# Patient Record
Sex: Male | Born: 1935 | Race: White | Hispanic: No | Smoking: Former smoker
Health system: Southern US, Community
[De-identification: ages and names within clinical notes are randomized; demographics above are authoritative.]

## PROBLEM LIST (undated history)

## (undated) DIAGNOSIS — I422 Other hypertrophic cardiomyopathy: Secondary | ICD-10-CM

## (undated) DIAGNOSIS — D649 Anemia, unspecified: Secondary | ICD-10-CM

## (undated) DIAGNOSIS — N183 Chronic kidney disease, stage 3 (moderate): Secondary | ICD-10-CM

## (undated) DIAGNOSIS — I2089 Other forms of angina pectoris: Secondary | ICD-10-CM

## (undated) DIAGNOSIS — F329 Major depressive disorder, single episode, unspecified: Secondary | ICD-10-CM

## (undated) DIAGNOSIS — I10 Essential (primary) hypertension: Secondary | ICD-10-CM

## (undated) DIAGNOSIS — N189 Chronic kidney disease, unspecified: Secondary | ICD-10-CM

## (undated) DIAGNOSIS — I509 Heart failure, unspecified: Secondary | ICD-10-CM

## (undated) DIAGNOSIS — C801 Malignant (primary) neoplasm, unspecified: Secondary | ICD-10-CM

## (undated) DIAGNOSIS — G4700 Insomnia, unspecified: Secondary | ICD-10-CM

## (undated) DIAGNOSIS — D75839 Thrombocytosis, unspecified: Secondary | ICD-10-CM

## (undated) DIAGNOSIS — D473 Essential (hemorrhagic) thrombocythemia: Secondary | ICD-10-CM

## (undated) DIAGNOSIS — F32A Depression, unspecified: Secondary | ICD-10-CM

## (undated) DIAGNOSIS — D490 Neoplasm of unspecified behavior of digestive system: Secondary | ICD-10-CM

## (undated) DIAGNOSIS — R4701 Aphasia: Secondary | ICD-10-CM

## (undated) DIAGNOSIS — N400 Enlarged prostate without lower urinary tract symptoms: Secondary | ICD-10-CM

## (undated) DIAGNOSIS — R1310 Dysphagia, unspecified: Secondary | ICD-10-CM

## (undated) DIAGNOSIS — K219 Gastro-esophageal reflux disease without esophagitis: Secondary | ICD-10-CM

## (undated) DIAGNOSIS — F319 Bipolar disorder, unspecified: Secondary | ICD-10-CM

## (undated) DIAGNOSIS — R195 Other fecal abnormalities: Secondary | ICD-10-CM

## (undated) DIAGNOSIS — K5909 Other constipation: Secondary | ICD-10-CM

## (undated) DIAGNOSIS — Z950 Presence of cardiac pacemaker: Secondary | ICD-10-CM

## (undated) DIAGNOSIS — I4891 Unspecified atrial fibrillation: Secondary | ICD-10-CM

## (undated) DIAGNOSIS — I208 Other forms of angina pectoris: Secondary | ICD-10-CM

## (undated) DIAGNOSIS — E870 Hyperosmolality and hypernatremia: Secondary | ICD-10-CM

## (undated) HISTORY — PX: PACEMAKER INSERTION: SHX728

## (undated) HISTORY — PX: FRACTURE SURGERY: SHX138

## (undated) HISTORY — DX: Neoplasm of unspecified behavior of digestive system: D49.0

## (undated) HISTORY — DX: Anemia, unspecified: D64.9

## (undated) HISTORY — DX: Other constipation: K59.09

## (undated) HISTORY — DX: Other fecal abnormalities: R19.5

---

## 1998-06-16 ENCOUNTER — Encounter: Payer: Self-pay | Admitting: *Deleted

## 1998-06-16 ENCOUNTER — Encounter: Payer: Self-pay | Admitting: Emergency Medicine

## 1998-06-17 ENCOUNTER — Inpatient Hospital Stay (HOSPITAL_COMMUNITY): Admission: EM | Admit: 1998-06-17 | Discharge: 1998-06-22 | Payer: Self-pay | Admitting: *Deleted

## 2002-12-13 ENCOUNTER — Emergency Department (HOSPITAL_COMMUNITY): Admission: EM | Admit: 2002-12-13 | Discharge: 2002-12-13 | Payer: Self-pay | Admitting: Emergency Medicine

## 2002-12-13 ENCOUNTER — Encounter: Payer: Self-pay | Admitting: Emergency Medicine

## 2002-12-23 ENCOUNTER — Encounter: Payer: Self-pay | Admitting: *Deleted

## 2002-12-23 ENCOUNTER — Emergency Department (HOSPITAL_COMMUNITY): Admission: EM | Admit: 2002-12-23 | Discharge: 2002-12-23 | Payer: Self-pay | Admitting: *Deleted

## 2009-03-04 ENCOUNTER — Ambulatory Visit: Payer: Self-pay | Admitting: Cardiology

## 2010-09-25 ENCOUNTER — Ambulatory Visit (INDEPENDENT_AMBULATORY_CARE_PROVIDER_SITE_OTHER): Payer: Medicare Other | Admitting: Internal Medicine

## 2010-09-25 DIAGNOSIS — Z7901 Long term (current) use of anticoagulants: Secondary | ICD-10-CM

## 2010-09-25 DIAGNOSIS — R195 Other fecal abnormalities: Secondary | ICD-10-CM

## 2010-10-02 ENCOUNTER — Other Ambulatory Visit (INDEPENDENT_AMBULATORY_CARE_PROVIDER_SITE_OTHER): Payer: Self-pay | Admitting: Internal Medicine

## 2010-10-02 ENCOUNTER — Encounter (INDEPENDENT_AMBULATORY_CARE_PROVIDER_SITE_OTHER): Payer: Medicare Other | Admitting: Internal Medicine

## 2010-10-02 ENCOUNTER — Ambulatory Visit (HOSPITAL_COMMUNITY)
Admission: RE | Admit: 2010-10-02 | Discharge: 2010-10-02 | Disposition: A | Discharge status: Home / Self Care | Payer: Medicare Other | Source: Ambulatory Visit | Attending: Internal Medicine | Admitting: Internal Medicine

## 2010-10-02 DIAGNOSIS — Z79899 Other long term (current) drug therapy: Secondary | ICD-10-CM | POA: Insufficient documentation

## 2010-10-02 DIAGNOSIS — D509 Iron deficiency anemia, unspecified: Secondary | ICD-10-CM | POA: Insufficient documentation

## 2010-10-02 DIAGNOSIS — Z950 Presence of cardiac pacemaker: Secondary | ICD-10-CM | POA: Insufficient documentation

## 2010-10-02 DIAGNOSIS — D126 Benign neoplasm of colon, unspecified: Secondary | ICD-10-CM

## 2010-10-02 DIAGNOSIS — D649 Anemia, unspecified: Secondary | ICD-10-CM

## 2010-10-02 DIAGNOSIS — E785 Hyperlipidemia, unspecified: Secondary | ICD-10-CM | POA: Insufficient documentation

## 2010-10-02 DIAGNOSIS — K921 Melena: Secondary | ICD-10-CM | POA: Insufficient documentation

## 2010-10-02 DIAGNOSIS — K5731 Diverticulosis of large intestine without perforation or abscess with bleeding: Secondary | ICD-10-CM

## 2010-10-02 DIAGNOSIS — I10 Essential (primary) hypertension: Secondary | ICD-10-CM | POA: Insufficient documentation

## 2010-10-02 DIAGNOSIS — R195 Other fecal abnormalities: Secondary | ICD-10-CM

## 2010-10-02 DIAGNOSIS — K6389 Other specified diseases of intestine: Secondary | ICD-10-CM

## 2010-10-02 DIAGNOSIS — K5730 Diverticulosis of large intestine without perforation or abscess without bleeding: Secondary | ICD-10-CM | POA: Insufficient documentation

## 2010-10-02 HISTORY — PX: COLONOSCOPY: SHX174

## 2010-10-02 LAB — DIFFERENTIAL
Basophils Absolute: 0.3 10*3/uL — ABNORMAL HIGH (ref 0.0–0.1)
Basophils Relative: 4 % — ABNORMAL HIGH (ref 0–1)
Eosinophils Absolute: 0.3 10*3/uL (ref 0.0–0.7)
Eosinophils Relative: 4 % (ref 0–5)
Lymphocytes Relative: 21 % (ref 12–46)
Lymphs Abs: 1.8 10*3/uL (ref 0.7–4.0)
Monocytes Absolute: 0.8 10*3/uL (ref 0.1–1.0)
Monocytes Relative: 10 % (ref 3–12)
Neutro Abs: 5.5 10*3/uL (ref 1.7–7.7)
Neutrophils Relative %: 63 % (ref 43–77)

## 2010-10-02 LAB — COMPREHENSIVE METABOLIC PANEL
ALT: 20 U/L (ref 0–53)
AST: 31 U/L (ref 0–37)
Albumin: 4 g/dL (ref 3.5–5.2)
Alkaline Phosphatase: 35 U/L — ABNORMAL LOW (ref 39–117)
BUN: 13 mg/dL (ref 6–23)
CO2: 29 mEq/L (ref 19–32)
Calcium: 9.1 mg/dL (ref 8.4–10.5)
Chloride: 104 mEq/L (ref 96–112)
Creatinine, Ser: 1.73 mg/dL — ABNORMAL HIGH (ref 0.4–1.5)
GFR calc Af Amer: 46 mL/min — ABNORMAL LOW (ref >60)
GFR calc non Af Amer: 38 mL/min — ABNORMAL LOW (ref >60)
Glucose, Bld: 94 mg/dL (ref 70–99)
Potassium: 3.9 mEq/L (ref 3.5–5.1)
Sodium: 143 mEq/L (ref 135–145)
Total Bilirubin: 0.8 mg/dL (ref 0.3–1.2)
Total Protein: 6.8 g/dL (ref 6.0–8.3)

## 2010-10-02 LAB — CBC
MCH: 29 pg (ref 26.0–34.0)
MCHC: 32.2 g/dL (ref 30.0–36.0)
MCV: 90 fL (ref 78.0–100.0)
Platelets: 795 10*3/uL — ABNORMAL HIGH (ref 150–400)
RDW: 15.8 % — ABNORMAL HIGH (ref 11.5–15.5)

## 2010-10-02 LAB — LACTATE DEHYDROGENASE: LDH: 224 U/L (ref 94–250)

## 2010-10-03 LAB — IRON AND TIBC: UIBC: 347 ug/dL

## 2010-10-13 NOTE — Op Note (Signed)
[name], [name]             ACCOUNT NO.:  [id number]  MEDICAL RECORD NO.:  [id number]           PATIENT TYPE:  O  LOCATION:  DAYP                          FACILITY:  [facility]  PHYSICIAN:  Lionel December, M.D.    DATE OF BIRTH:  04-12-1925  DATE OF PROCEDURE:  10/02/2010 DATE OF DISCHARGE:                              OPERATIVE REPORT   PROCEDURE:  Colonoscopy with snare polypectomy.  INDICATIONS:  [name] is an 77 year old Caucasian male who was recently found to have mild anemia and heme-positive stools and thrombocytosis.  He was evaluated by Dr. Derald Macleod.  A colonoscopy was advised to make sure he does not have a colonic neoplasm.  He has had problems with constipation.  Procedure risks were reviewed with the patient.  Informed consent was obtained.  This is the patient's first colonoscopy.  MEDICATIONS FOR CONSCIOUS SEDATION:  Versed 3 mg IV.  FINDINGS:  Procedure performed in endoscopy suite.  The patient's vitalsigns and O2 saturations were monitored during the procedure and remained stable.  The patient was placed in left lateral recumbent position and rectal examination performed.  No abnormality noted on external or digital exam.  Pentax videoscope was placed through rectum and advanced under vision into sigmoid colon and beyond.  Preparation was excellent.  He had some liquid stool in dependent areas which was easily suctioned out.  He had diffuse pigmentation which was more pronounced in the ascending colon and cecum consistent with melanosis coli.  Few diverticula were noted at sigmoid colon.  Scope was passed into cecum which was identified by appendiceal orifice and ileocecal valve.  Pictures were taken for the record.  As the scope was withdrawn, colonic mucosa was carefully examined.  There was a 4 to 5-mm polyp at hepatic flexure which was ablated via cold biopsy.  There was another 6 to 7-mm sessile polyp at splenic flexure.  This polyp was elevated to  6 mL of saline injection and polypectomy performed.  This polyp was retrieved for sludge examination.  Mucosa of rest of the colon was normal.  Rectal mucosa similarly was normal.  The scope was retroflexed to examine anorectal junction and small hemorrhoids noted below the dentate line.  Endoscope was straightened and withdrawn.  Withdrawal time was 19 minutes.  The patient tolerated the procedure well.  FINAL DIAGNOSES: 1. Examination performed to cecum. 2. Melanosis coli with diffuse pigmentation which was more pronounced     in the ascending colon and cecum. 3. Two polyps removed, one via cold biopsy rheumatic flexion and other     one from splenic flexure via saline-assisted polypectomy. 4. Few scattered diverticula at sigmoid colon and external     hemorrhoids.  RECOMMENDATIONS: 1. Standard instructions given.  No aspirin for 1 week. 2. High-fiber diet.  If he continues to experience problems with     constipation, he can start taking 2 tablets of Colace daily.  I     will be contacting the patient and his daughter with biopsy     results.          ______________________________ Lionel December, M.D.     NR/MEDQ  D:  10/02/2010  T:  10/03/2010  Job:  [id number]  cc:   Normand Sloop, MD Fax: [phone]  Paulene Floor, NP [facility]  Electronically Signed by Lionel December M.D. on 10/13/2010 09:17:32 PM

## 2010-10-13 NOTE — Consult Note (Addendum)
NAMECLEMENTE, [name]             ACCOUNT NO.:  [id number]  MEDICAL RECORD NO.:  [id number]           PATIENT TYPE: AMB.  LOCATION:                                 [facility]  PHYSICIAN:  Lionel December, M.D.    DATE OF BIRTH:  1924-07-04  DATE :  09/25/2010                                 CONSULTATION   REFERRING PHYSICIAN:  Normand Sloop, MD    REASON FOR CONSULTATION:  Anemia, heme-positive stools.  HISTORY OF PRESENT ILLNESS:  [name] is an 77 year old male referred to our office by Dr. Derald Macleod for heme-positive stools and anemia.  He says he saw Dr. Derald Macleod about 3 weeks ago and two of his stool cards were positive for blood.  He has never undergone a colonoscopy in the past.  On September 04, 2010, WBC count was 9400, hemoglobin 12.8, platelets 874,000, and his fecal occult blood was positive x2.  Iron studies:  Iron was 61, TIBC 473, percent saturation 13.  Mr. Duman does see Dr. Derald Macleod for his high platelet count.  He says his appetite is good.  There has been no weight loss, no abdominal pain.  He usually has one bowel movement a day.  His stools are brown in color.  He does complain of some constipation at times.  If he eats, he will bloat.  He will have sensation of bloating if he does not take his omeprazole.  He is allergic to DEMEROL and MORPHINE.  PAST SURGICAL HISTORY:  He has had appendectomy, right inguinal repair, right knee arthroscopy, a pacemaker.  PAST MEDICAL HISTORY: 1. Atrial fib. 2. Pacemaker. 3. Hypertension. 4. High cholesterol.  FAMILY HISTORY:  His mother is deceased from natural causes.  Father is deceased from coronary artery disease.  He has five sisters, one is deceased from breathing problems, four are alive.  He has four brothers, one is deceased at age 64 from gangrene, one is deceased from lung cancer, and two are alive.  He is widowed.  He is retired.  He does not smoke, drink, or do drugs.  He has four  children, three are in good health.  One was involved in an MVC.  HOME MEDICATIONS: 1. Temazepam 7.5 mg. 2. Atenolol 50 mg. 3. Omeprazole 20 mg. 4. TriCor 145 mg. 5. Potassium chloride 20 mEq one a day. 6. Warfarin 2 mg daily except 1 day on which he takes 3 mg. 7. Lasix 40 mg 1-1/2 tablets b.i.d. 8. Flomax 0.4 mg a day. 9. Gabapentin 100 mg a day. 10.Crestor 10 mg a day. 11.Vitamin B12 - 1000 mg a day. 12.Vitamin D3 two a day. 13.Senna 4 tablets a day.  OBJECTIVE:  VITAL SIGNS:  His weight is 216, his height is 6 feet 2 inches, temperature is 98.4, blood pressure is 102/70, pulse is 72. HEENT:  He has upper and lower dentures.  His oral mucosa is moist. There are no lesions.  His conjunctivae are pink.  His sclerae are anicteric. NECK:  His thyroid is normal.  There is no cervical lymphadenopathy. LUNGS:  Clear. HEART:  Regular. ABDOMEN:  Soft.  Bowel sounds  are positive.  No masses.  His stool was guaiac negative today.  ASSESSMENT:  [name] is an 77 year old male, referred to our office by Dr. Derald Macleod for anemia and heme-positive stools.  A colonic neoplasm or arteriovenous malformation needs to be ruled out.  RECOMMENDATIONS:  We will schedule a colonoscopy in the near future with Dr. Karilyn Cota.  The risks and benefits were reviewed with the patient.  He is agreeable.  His daughter is present in the room also.  Thank you for allowing [place] to participate in his care.    ______________________________ Dorene Ar, NP   ______________________________ Lionel December, M.D.    TS/MEDQ  D:  09/26/2010  T:  09/26/2010  Job:  [id number]  cc:   Normand Sloop, MD Fax: [phone]  Electronically Signed by Lionel December M.D. on 10/13/2010 09:19:29 PM

## 2010-11-12 DIAGNOSIS — I2510 Atherosclerotic heart disease of native coronary artery without angina pectoris: Secondary | ICD-10-CM

## 2011-01-29 ENCOUNTER — Encounter (INDEPENDENT_AMBULATORY_CARE_PROVIDER_SITE_OTHER): Payer: Self-pay

## 2011-10-20 ENCOUNTER — Other Ambulatory Visit (HOSPITAL_COMMUNITY): Payer: Self-pay | Admitting: Nephrology

## 2011-10-20 DIAGNOSIS — N289 Disorder of kidney and ureter, unspecified: Secondary | ICD-10-CM

## 2011-10-31 ENCOUNTER — Encounter (HOSPITAL_COMMUNITY): Payer: Self-pay

## 2011-10-31 ENCOUNTER — Emergency Department (HOSPITAL_COMMUNITY): Payer: Medicare Other

## 2011-10-31 ENCOUNTER — Emergency Department (HOSPITAL_COMMUNITY)
Admission: EM | Admit: 2011-10-31 | Discharge: 2011-10-31 | Disposition: A | Discharge status: Home / Self Care | Payer: Medicare Other | Attending: Emergency Medicine | Admitting: Emergency Medicine

## 2011-10-31 DIAGNOSIS — M47812 Spondylosis without myelopathy or radiculopathy, cervical region: Secondary | ICD-10-CM | POA: Insufficient documentation

## 2011-10-31 DIAGNOSIS — J438 Other emphysema: Secondary | ICD-10-CM | POA: Insufficient documentation

## 2011-10-31 DIAGNOSIS — Y92009 Unspecified place in unspecified non-institutional (private) residence as the place of occurrence of the external cause: Secondary | ICD-10-CM | POA: Insufficient documentation

## 2011-10-31 DIAGNOSIS — S8000XA Contusion of unspecified knee, initial encounter: Secondary | ICD-10-CM | POA: Insufficient documentation

## 2011-10-31 DIAGNOSIS — F319 Bipolar disorder, unspecified: Secondary | ICD-10-CM | POA: Insufficient documentation

## 2011-10-31 DIAGNOSIS — W19XXXA Unspecified fall, initial encounter: Secondary | ICD-10-CM

## 2011-10-31 DIAGNOSIS — N189 Chronic kidney disease, unspecified: Secondary | ICD-10-CM | POA: Insufficient documentation

## 2011-10-31 DIAGNOSIS — I422 Other hypertrophic cardiomyopathy: Secondary | ICD-10-CM | POA: Insufficient documentation

## 2011-10-31 DIAGNOSIS — W1809XA Striking against other object with subsequent fall, initial encounter: Secondary | ICD-10-CM | POA: Insufficient documentation

## 2011-10-31 DIAGNOSIS — Z79899 Other long term (current) drug therapy: Secondary | ICD-10-CM | POA: Insufficient documentation

## 2011-10-31 DIAGNOSIS — M542 Cervicalgia: Secondary | ICD-10-CM | POA: Insufficient documentation

## 2011-10-31 DIAGNOSIS — S0093XA Contusion of unspecified part of head, initial encounter: Secondary | ICD-10-CM

## 2011-10-31 DIAGNOSIS — S0003XA Contusion of scalp, initial encounter: Secondary | ICD-10-CM | POA: Insufficient documentation

## 2011-10-31 HISTORY — DX: Depression, unspecified: F32.A

## 2011-10-31 HISTORY — DX: Major depressive disorder, single episode, unspecified: F32.9

## 2011-10-31 HISTORY — DX: Bipolar disorder, unspecified: F31.9

## 2011-10-31 HISTORY — DX: Unspecified atrial fibrillation: I48.91

## 2011-10-31 HISTORY — DX: Presence of cardiac pacemaker: Z95.0

## 2011-10-31 HISTORY — DX: Essential (hemorrhagic) thrombocythemia: D47.3

## 2011-10-31 HISTORY — DX: Chronic kidney disease, unspecified: N18.9

## 2011-10-31 HISTORY — DX: Thrombocytosis, unspecified: D75.839

## 2011-10-31 HISTORY — DX: Other hypertrophic cardiomyopathy: I42.2

## 2011-10-31 LAB — DIFFERENTIAL
Basophils Absolute: 0.3 10*3/uL — ABNORMAL HIGH (ref 0.0–0.1)
Lymphocytes Relative: 29 % (ref 12–46)
Lymphs Abs: 1.9 10*3/uL (ref 0.7–4.0)
Monocytes Absolute: 0.5 10*3/uL (ref 0.1–1.0)
Neutro Abs: 3.6 10*3/uL (ref 1.7–7.7)

## 2011-10-31 LAB — CBC
HCT: 36.9 % — ABNORMAL LOW (ref 39.0–52.0)
RBC: 3.86 MIL/uL — ABNORMAL LOW (ref 4.22–5.81)
RDW: 15.3 % (ref 11.5–15.5)
WBC: 6.6 10*3/uL (ref 4.0–10.5)

## 2011-10-31 LAB — PROTIME-INR: INR: 2.84 — ABNORMAL HIGH (ref 0.00–1.49)

## 2011-10-31 NOTE — ED Notes (Signed)
Ambulated patient around nurses station with no problems.  

## 2011-10-31 NOTE — Discharge Instructions (Signed)
Contusion A contusion is a deep bruise. Contusions are the result of an injury that caused bleeding under the skin. The contusion may turn blue, purple, or yellow. Minor injuries will give you a painless contusion, but more severe contusions may stay painful and swollen for a few weeks.  CAUSES  A contusion is usually caused by a blow, trauma, or direct force to an area of the body. SYMPTOMS   Swelling and redness of the injured area.   Bruising of the injured area.   Tenderness and soreness of the injured area.   Pain.  DIAGNOSIS  The diagnosis can be made by taking a history and physical exam. An X-ray, CT scan, or MRI may be needed to determine if there were any associated injuries, such as fractures. TREATMENT  Specific treatment will depend on what area of the body was injured. In general, the best treatment for a contusion is resting, icing, elevating, and applying cold compresses to the injured area. Over-the-counter medicines may also be recommended for pain control. Ask your caregiver what the best treatment is for your contusion. HOME CARE INSTRUCTIONS   Put ice on the injured area.   Put ice in a plastic bag.   Place a towel between your skin and the bag.   Leave the ice on for 15 to 20 minutes, 3 to 4 times a day.   Only take over-the-counter or prescription medicines for pain, discomfort, or fever as directed by your caregiver. Your caregiver may recommend avoiding anti-inflammatory medicines (aspirin, ibuprofen, and naproxen) for 48 hours because these medicines may increase bruising.   Rest the injured area.   If possible, elevate the injured area to reduce swelling.  SEEK IMMEDIATE MEDICAL CARE IF:   You have increased bruising or swelling.   You have pain that is getting worse.   Your swelling or pain is not relieved with medicines.  MAKE SURE YOU:   Understand these instructions.   Will watch your condition.   Will get help right away if you are not  doing well or get worse.  Document Released: 03/05/2005 Document Revised: 05/15/2011 Document Reviewed: 03/31/2011 [facility] Patient Information 2012 [place], [place].  Fall Prevention, Elderly Falls are the leading cause of injuries, accidents, and accidental deaths in people over the age of 73. Falling is a real threat to your ability to live on your own. CAUSES   Poor eyesight or poor hearing can make you more likely to fall.   Illnesses and physical conditions can affect your strength and balance.   Poor lighting, throw rugs and pets in your home can make you more likely to trip or slip.   The side effects of some medicines can upset your balance and lead to falling. These include medicines for depression, sleep problems, high blood pressure, diabetes, and heart conditions.  PREVENTION  Be sure your home is as safe as possible. Here are some tips:  Wear shoes with non-skid soles (not house slippers).   Be sure your home and outside area are well lit.   Use night lights throughout your house, including hallways and stairways.   Remove clutter and clean up spills on floors and walkways.   Remove throw rugs or fasten them to the floor with carpet tape. Tack down carpet edges.   Do not place electrical cords across pathways.   Install grab bars in your bathtub, shower, and toilet area. Towel bars should not be used as a grab bar.   Install handrails on both  sides of stairways.   Do not climb on stools or stepladders. Get someone else to help with jobs that require climbing.   Do not wax your floors at all, or use a non-skid wax.   Repair uneven or unsafe sidewalks, walkways or stairs.   Keep frequently used items within reach.   Be aware of pets so you do not trip.  Get regular check-ups from your doctor, and take good care of yourself:  Have your eyes checked every year for vision changes, cataracts, glaucoma, and other eye problems. Wear eyeglasses as directed.   Have  your hearing checked every 2 years, or anytime you or others think that you cannot hear well. Use hearing aids as directed.   See your caregiver if you have foot pain or corns. Sore feet can contribute to falls.   Let your caregiver know if a medicine is making you feel dizzy or making you lose your balance.   Use a cane, walker, or wheelchair as directed. Use walker or wheelchair brakes when getting in and out.   When you get up from bed, sit on the side of the bed for 1 to 2 minutes before you stand up. This will give your blood pressure time to adjust, and you will feel less dizzy.   If you need to go to the bathroom often, consider using a bedside commode.  Keep your body in good shape:  Get regular exercise, especially walking.   Do exercises to strengthen the muscles you use for walking and lifting.   Do not smoke.   Minimize use of alcohol.  SEEK IMMEDIATE MEDICAL CARE IF:   You feel dizzy, weak, or unsteady on your feet.   You feel confused.   You fall.  Document Released: 05/26/2005 Document Revised: 05/15/2011 Document Reviewed: 11/20/2006 [facility] Patient Information 2012 [place], [place].

## 2011-10-31 NOTE — ED Provider Notes (Signed)
History  This chart was scribed for Christopher Bailiff, MD by [name]. The patient was seen in room APA04/APA04. Patient's care was started at 2031.  CSN: [id number]  Arrival date & time 10/31/11  2031   First MD Initiated Contact with Patient 10/31/11 2031      Chief Complaint  Patient presents with  . Fall    (Consider location/radiation/quality/duration/timing/severity/associated sxs/prior treatment) Patient is a 78 y.o. male presenting with fall. The history is provided by the patient. No language interpreter was used.  Fall Pertinent negatives include no fever, no abdominal pain, no nausea, no vomiting and no headaches.    [name] is a 78 y.o. male on coumadin who lives at [facility] and was brought into the Emergency Department by ambulance complaining of a fall that occurred immediately prior to arrival in the ED. Pt reports that he tripped over a rug in his home and hit his head on the edge of his night stand. Pt states that he never lost consciousness and does not have any headache. Pt is complaining of mild pain localized to the right shoulder and right knee and a cut above the left eye. Pt denies nausea, vomiting, back pain, and neck pain.   Past Medical History  Diagnosis Date  . Anemia   . Chronic constipation   . Bloating   . Colonic neoplasm   . Occult blood positive stool   . A-fib   . Hypertrophic cardiomyopathy   . Bipolar disorder   . Diastolic heart failure   . Pacemaker   . Depression   . Chronic kidney disease   . Thrombocytosis     Past Surgical History  Procedure Date  . Colonoscopy 10/02/2010    W/POLYP  . Fracture surgery   . Pacemaker insertion   . Cholecystectomy     No family history on file.  History  Substance Use Topics  . Smoking status: Never Smoker   . Smokeless tobacco: Not on file  . Alcohol Use: No      Review of Systems  Constitutional: Negative for fever and chills.  HENT: Negative for ear pain and neck  pain.   Gastrointestinal: Negative for nausea, vomiting and abdominal pain.  Musculoskeletal: Positive for myalgias and arthralgias.  Neurological: Negative for syncope, light-headedness and headaches.  Psychiatric/Behavioral: Negative for confusion.  All other systems reviewed and are negative.    Allergies  Demerol and Morphine and related  Home Medications   Current Outpatient Rx  Name Route Sig Dispense Refill  . FUROSEMIDE 20 MG PO TABS Oral Take 20-40 mg by mouth 2 (two) times daily. Take two tablets by mouth daily in the morning and one tablet in the evening    . GABAPENTIN 100 MG PO CAPS Oral Take 100 mg by mouth 2 (two) times daily.     Marland Kitchen HYDROXYUREA 500 MG PO CAPS Oral Take 500 mg by mouth every other day. May take with food to minimize GI side effects.    Marland Kitchen LORATADINE 10 MG PO TABS Oral Take 10 mg by mouth daily.    Marland Kitchen METOPROLOL TARTRATE 25 MG PO TABS Oral Take 25 mg by mouth 2 (two) times daily.    . CERTA-VITE SENIOR-LUTEIN PO Oral Take 1 tablet by mouth every morning.    Marland Kitchen NITROGLYCERIN 0.2 MG/HR TD PT24 Transdermal Place 1 patch onto the skin daily.    Marland Kitchen OMEPRAZOLE 20 MG PO CPDR Oral Take 20 mg by mouth daily.      Marland Kitchen  POTASSIUM CHLORIDE 20 MEQ PO PACK Oral Take 20 mEq by mouth daily.     Marland Kitchen RISPERIDONE 1 MG PO TABS Oral Take 1 mg by mouth 2 (two) times daily.    . SENNA 8.6 MG PO TABS Oral Take 2 tablets by mouth.    . TAMSULOSIN HCL 0.4 MG PO CAPS Oral Take 0.4 mg by mouth every evening.     Marland Kitchen TEMAZEPAM 7.5 MG PO CAPS Oral Take 7.5 mg by mouth every evening. 6pm    . VITAMIN B-12 1000 MCG PO TABS Oral Take 1,000 mcg by mouth daily.      . WARFARIN SODIUM 2 MG PO TABS Oral Take 2 mg by mouth every evening. 6pm      BP 121/67  Pulse 75  Temp(Src) 98 F (36.7 C) (Oral)  Resp 20  Ht 6\' 2"  (1.88 m)  Wt 210 lb (95.255 kg)  BMI 26.96 kg/m2  SpO2 98%  Physical Exam  Nursing note and vitals reviewed. Constitutional: He is oriented to person, place, and time. He  appears well-developed and well-nourished.  HENT:  Head: Normocephalic.  Right Ear: External ear normal.  Left Ear: External ear normal.  Eyes: Conjunctivae are normal. Pupils are equal, round, and reactive to light. No scleral icterus.  Neck: Normal range of motion. Neck supple.  Cardiovascular: Normal rate and regular rhythm.   Pulmonary/Chest: Effort normal. No respiratory distress.  Abdominal: Soft. Bowel sounds are normal. There is no tenderness.  Musculoskeletal: Normal range of motion. He exhibits no edema and no tenderness (no back tenderness).  Neurological: He is alert and oriented to person, place, and time.  Skin: Skin is warm and dry.  Psychiatric: He has a normal mood and affect. His behavior is normal.    ED Course  Procedures (including critical care time)  DIAGNOSTIC STUDIES: Oxygen Saturation is 98% on room air, normal by my interpretation.    COORDINATION OF CARE: 8:39PM - Will get x-rays of pt. Pt agrees with plan.    Labs Reviewed  PROTIME-INR - Abnormal; Notable for the following:    Prothrombin Time 30.3 (*)    INR 2.84 (*)    All other components within normal limits  CBC - Abnormal; Notable for the following:    RBC 3.86 (*)    Hemoglobin 11.8 (*)    HCT 36.9 (*)    Platelets 540 (*)    All other components within normal limits  DIFFERENTIAL - Abnormal; Notable for the following:    Basophils Relative 4 (*)    Basophils Absolute 0.3 (*)    All other components within normal limits   Dg Chest 1 View  10/31/2011  *RADIOLOGY REPORT*  Clinical Data: Cough after fall today.  Atrial fibrillation and pacemaker.  CHEST - 1 VIEW  Comparison: 11/12/2010  Findings: Stable appearance of cardiac pacemaker since previous study.  Shallow inspiration.  Cardiac enlargement with prominent pulmonary vascularity and mild interstitial changes suggesting pulmonary vascular congestion.  Peribronchial thickening consistent with chronic bronchitis.  No focal airspace  consolidation.  No blunting of costophrenic angles.  No pneumothorax.  IMPRESSION: Cardiac enlargement with mild pulmonary vascular congestion. Chronic bronchitic changes.  No focal consolidation.  Original Report Authenticated By: Marlon Pel, M.D.   Ct Head Wo Contrast  10/31/2011  *RADIOLOGY REPORT*  Clinical Data:  Fall.  Head injury.  Left periorbital laceration.  CT HEAD WITHOUT CONTRAST CT CERVICAL SPINE WITHOUT CONTRAST  Technique:  Multidetector CT imaging of the head and cervical  spine was performed following the standard protocol without intravenous contrast.  Multiplanar CT image reconstructions of the cervical spine were also generated.  Comparison:  CT head from [facility] dated 03/04/2009  CT HEAD  Findings: Left supraorbital soft tissue swelling noted.  No definite intraorbital abnormality is identified (high resolution imaging of the orbit was not performed).  The brain stem, cerebellum, cerebral peduncles, thalami, basal ganglia, basilar cisterns, and ventricular system appear unremarkable.  No intracranial hemorrhage, mass lesion, or acute infarction is identified.  IMPRESSION:  1.  Left supraorbital soft tissue swelling in the scalp.  No acute intracranial findings.  CT CERVICAL SPINE  Findings: Cervical spondylosis noted, with spurring from the anterior arch of C1 extending between the odontoid and the basion as on image 29 of series 7, and with intervertebral disc calcification at all levels between C3 and T1.  Bony facet fusion is suspected on the left at C4-5 and on the right at all levels between C2 and C5.  Facet spurring is present, with osseous foraminal stenosis on the right at all levels between C2 and C7, and on the left at C3-4 and to a lesser degree at C4-5, C5-6, C6-7, and C7-T1.  There is 2 mm of chronic appearing degenerative anterolisthesis of C4 on C5.  Reduced intervertebral disc height at C7-T1 is more prominent anteriorly, resulting in slight kyphosis, but  without fracture.  The left facet joint at C7-T1 appears fused.  Biapical scarring noted with evidence of emphysema.  No cervical spine fracture is observed.  IMPRESSION:  1.  No cervical spine fracture or acute subluxation noted. 2.  Severe cervical spondylosis as described above.  Multilevel foraminal impingement. 3.  Emphysema.  Original Report Authenticated By: Dellia Cloud, M.D.   Ct Cervical Spine Wo Contrast  10/31/2011  *RADIOLOGY REPORT*  Clinical Data:  Fall.  Head injury.  Left periorbital laceration.  CT HEAD WITHOUT CONTRAST CT CERVICAL SPINE WITHOUT CONTRAST  Technique:  Multidetector CT imaging of the head and cervical spine was performed following the standard protocol without intravenous contrast.  Multiplanar CT image reconstructions of the cervical spine were also generated.  Comparison:  CT head from [facility] dated 03/04/2009  CT HEAD  Findings: Left supraorbital soft tissue swelling noted.  No definite intraorbital abnormality is identified (high resolution imaging of the orbit was not performed).  The brain stem, cerebellum, cerebral peduncles, thalami, basal ganglia, basilar cisterns, and ventricular system appear unremarkable.  No intracranial hemorrhage, mass lesion, or acute infarction is identified.  IMPRESSION:  1.  Left supraorbital soft tissue swelling in the scalp.  No acute intracranial findings.  CT CERVICAL SPINE  Findings: Cervical spondylosis noted, with spurring from the anterior arch of C1 extending between the odontoid and the basion as on image 29 of series 7, and with intervertebral disc calcification at all levels between C3 and T1.  Bony facet fusion is suspected on the left at C4-5 and on the right at all levels between C2 and C5.  Facet spurring is present, with osseous foraminal stenosis on the right at all levels between C2 and C7, and on the left at C3-4 and to a lesser degree at C4-5, C5-6, C6-7, and C7-T1.  There is 2 mm of chronic appearing  degenerative anterolisthesis of C4 on C5.  Reduced intervertebral disc height at C7-T1 is more prominent anteriorly, resulting in slight kyphosis, but without fracture.  The left facet joint at C7-T1 appears fused.  Biapical scarring noted with evidence of  emphysema.  No cervical spine fracture is observed.  IMPRESSION:  1.  No cervical spine fracture or acute subluxation noted. 2.  Severe cervical spondylosis as described above.  Multilevel foraminal impingement. 3.  Emphysema.  Original Report Authenticated By: Dellia Cloud, M.D.   Dg Knee Complete 4 Views Right  10/31/2011  *RADIOLOGY REPORT*  Clinical Data: Right knee pain after fall today.  RIGHT KNEE - COMPLETE 4+ VIEW  Comparison: None.  Findings: Previous postoperative changes with two fixation screws in the distal right femoral condylar region.  Hardware appears intact.  Degenerative changes with lateral greater than medial compartment narrowing and tricompartmental degenerative changes. Cord irregularity along the distal femoral condyles bilaterally consistent with degenerative changes.  Small osteochondral defects are likely.  No evidence of acute fracture or subluxation.  No focal bone lesion or bone destruction.  Bone cortex and trabecular architecture appear intact.  No significant effusion.  Soft tissue calcifications are likely vascular.  IMPRESSION: Old postoperative and degenerative changes in the right knee.  No acute fractures identified.  Original Report Authenticated By: Marlon Pel, M.D.     1. Fall   2. Head contusion   3. Knee contusion       MDM  Mechanical fall. Imaging is negative. Blood work was obtained to assess INR. INR is therapeutic. Was able to ambulate around the department without difficulty. Workup was unremarkable. He'll be discharged back to his facility in good condition. Provided fall precautions. Struck to followup with his primary care physician.      I personally performed the services  described in this documentation, which was scribed in my presence. The recorded information has been reviewed and considered.    Christopher Bailiff, MD 10/31/11 2236

## 2011-10-31 NOTE — ED Notes (Signed)
MD removed backboard from patient

## 2011-10-31 NOTE — ED Notes (Signed)
Per ems, pt from [facility].  Facility reports that the pt tripped over rug and fell.  Facility reports the pt hit his head on the edge of his night stand.  Pt is alert and oriented.  Pt reports some pain to his rt shoulder.  Pt has laceration to his left eye.  Bleeding is controlled.

## 2011-11-04 ENCOUNTER — Ambulatory Visit (HOSPITAL_COMMUNITY)
Admission: RE | Admit: 2011-11-04 | Discharge: 2011-11-04 | Disposition: A | Discharge status: Home / Self Care | Payer: Medicare Other | Source: Ambulatory Visit | Attending: Nephrology | Admitting: Nephrology

## 2011-11-04 DIAGNOSIS — Q619 Cystic kidney disease, unspecified: Secondary | ICD-10-CM | POA: Insufficient documentation

## 2011-11-04 DIAGNOSIS — N189 Chronic kidney disease, unspecified: Secondary | ICD-10-CM | POA: Insufficient documentation

## 2011-11-04 DIAGNOSIS — N289 Disorder of kidney and ureter, unspecified: Secondary | ICD-10-CM

## 2012-02-27 ENCOUNTER — Encounter: Payer: Medicare Other | Admitting: Internal Medicine

## 2012-03-04 ENCOUNTER — Encounter: Payer: Medicare Other | Admitting: Internal Medicine

## 2012-03-04 DIAGNOSIS — D473 Essential (hemorrhagic) thrombocythemia: Secondary | ICD-10-CM

## 2012-03-04 DIAGNOSIS — Z7901 Long term (current) use of anticoagulants: Secondary | ICD-10-CM

## 2012-08-24 ENCOUNTER — Encounter: Payer: Medicare Other | Admitting: Internal Medicine

## 2012-08-24 DIAGNOSIS — D473 Essential (hemorrhagic) thrombocythemia: Secondary | ICD-10-CM

## 2012-08-24 DIAGNOSIS — I82409 Acute embolism and thrombosis of unspecified deep veins of unspecified lower extremity: Secondary | ICD-10-CM

## 2012-08-30 ENCOUNTER — Other Ambulatory Visit: Payer: Self-pay | Admitting: *Deleted

## 2012-08-30 MED ORDER — TEMAZEPAM 7.5 MG PO CAPS: 7.5000 mg | ORAL_CAPSULE | Freq: Every evening | ORAL | Status: DC

## 2012-08-30 NOTE — Telephone Encounter (Signed)
Temazepam 7.5mg  oral q evening 6pm Qty 30 called to [facility]. Message left at [facility] for pt.

## 2012-09-16 DIAGNOSIS — R079 Chest pain, unspecified: Secondary | ICD-10-CM

## 2012-09-27 ENCOUNTER — Ambulatory Visit: Payer: Self-pay | Admitting: Nurse Practitioner

## 2012-09-30 ENCOUNTER — Other Ambulatory Visit: Payer: Self-pay | Admitting: *Deleted

## 2012-09-30 MED ORDER — HYDROCODONE-ACETAMINOPHEN 5-325 MG PO TABS: ORAL_TABLET | ORAL | Status: DC

## 2012-09-30 MED ORDER — TEMAZEPAM 7.5 MG PO CAPS: 7.5000 mg | ORAL_CAPSULE | Freq: Every evening | ORAL | Status: DC

## 2012-09-30 NOTE — Telephone Encounter (Signed)
Last refill on temazepam 08/30/12. Last refill on HYDROCODON 09/16/12 FOR # 10. WE HAVENT FILLED THIS BEFORE. FILLED BY A DOCTOR AT [facility]. PLEASE CALL BOTH IN TO [facility] SINCE PT IS AT [place]. SEE ALLERGIES.Marland Kitchen THANKS.

## 2012-10-05 ENCOUNTER — Ambulatory Visit (INDEPENDENT_AMBULATORY_CARE_PROVIDER_SITE_OTHER): Payer: Medicare Other | Admitting: Family Medicine

## 2012-10-05 ENCOUNTER — Encounter: Payer: Self-pay | Admitting: Family Medicine

## 2012-10-05 VITALS — BP 126/67 | HR 87 | Temp 97.1°F | Wt 216.0 lb

## 2012-10-05 DIAGNOSIS — IMO0002 Reserved for concepts with insufficient information to code with codable children: Secondary | ICD-10-CM

## 2012-10-05 DIAGNOSIS — I4891 Unspecified atrial fibrillation: Secondary | ICD-10-CM

## 2012-10-05 DIAGNOSIS — S83511S Sprain of anterior cruciate ligament of right knee, sequela: Secondary | ICD-10-CM

## 2012-10-05 DIAGNOSIS — S83519A Sprain of anterior cruciate ligament of unspecified knee, initial encounter: Secondary | ICD-10-CM | POA: Insufficient documentation

## 2012-10-05 LAB — POCT INR: INR: 2.4
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
## 2012-10-06 DIAGNOSIS — Z7901 Long term (current) use of anticoagulants: Secondary | ICD-10-CM | POA: Insufficient documentation

## 2012-11-08 ENCOUNTER — Ambulatory Visit (INDEPENDENT_AMBULATORY_CARE_PROVIDER_SITE_OTHER): Payer: Medicare Other | Admitting: Pharmacist

## 2012-11-08 DIAGNOSIS — Z7901 Long term (current) use of anticoagulants: Secondary | ICD-10-CM

## 2012-11-08 DIAGNOSIS — I4891 Unspecified atrial fibrillation: Secondary | ICD-10-CM

## 2012-11-08 LAB — POCT INR: INR: 2.6

## 2012-11-08 NOTE — Patient Instructions (Signed)
Anticoagulation Dose Instructions as of 11/08/2012     [name] Tue Wed Thu Fri Sat   New Dose 2 mg 2 mg 2 mg 4 mg 2 mg 2 mg 2 mg    Description       Continue same dose       INR was 2.6 today

## 2012-11-12 ENCOUNTER — Other Ambulatory Visit: Payer: Self-pay | Admitting: *Deleted

## 2012-11-12 MED ORDER — TEMAZEPAM 7.5 MG PO CAPS: 7.5000 mg | ORAL_CAPSULE | Freq: Every evening | ORAL | Status: DC

## 2012-11-12 NOTE — Telephone Encounter (Signed)
Patient at [facility]. Last seen in office on 11-03-12. Please advise

## 2012-11-29 NOTE — Telephone Encounter (Signed)
PLEASE CALL IN. NOT CALLED IN YET. THANKS 

## 2012-11-30 ENCOUNTER — Other Ambulatory Visit: Payer: Self-pay | Admitting: *Deleted

## 2012-11-30 NOTE — Telephone Encounter (Signed)
Called in to [facility]

## 2012-12-08 ENCOUNTER — Ambulatory Visit (INDEPENDENT_AMBULATORY_CARE_PROVIDER_SITE_OTHER): Payer: Medicare Other | Admitting: Pharmacist

## 2012-12-08 DIAGNOSIS — I4891 Unspecified atrial fibrillation: Secondary | ICD-10-CM

## 2012-12-08 DIAGNOSIS — Z7901 Long term (current) use of anticoagulants: Secondary | ICD-10-CM

## 2012-12-08 LAB — POCT INR: INR: 2.5

## 2012-12-08 NOTE — Patient Instructions (Signed)
Anticoagulation Dose Instructions as of 12/08/2012     [name] Tue Wed Thu Fri Sat   New Dose 2 mg 2 mg 2 mg 4 mg 2 mg 2 mg 2 mg    Description       Continue same dose       INR was 2.5 today

## 2012-12-20 ENCOUNTER — Telehealth: Payer: Self-pay | Admitting: Family Medicine

## 2012-12-20 NOTE — Telephone Encounter (Signed)
Patient lives at [facility]. Painful abscess on back. They have applied warm compresses and would like to have it lanced.    Appt scheduled for tomorrow.  [facility] aware.

## 2012-12-21 ENCOUNTER — Ambulatory Visit (INDEPENDENT_AMBULATORY_CARE_PROVIDER_SITE_OTHER): Payer: Medicare Other | Admitting: Nurse Practitioner

## 2012-12-21 ENCOUNTER — Encounter: Payer: Self-pay | Admitting: Nurse Practitioner

## 2012-12-21 VITALS — BP 105/65 | HR 76 | Temp 97.7°F | Wt 220.0 lb

## 2012-12-21 DIAGNOSIS — L02229 Furuncle of trunk, unspecified: Secondary | ICD-10-CM

## 2012-12-21 DIAGNOSIS — R229 Localized swelling, mass and lump, unspecified: Secondary | ICD-10-CM

## 2012-12-21 DIAGNOSIS — L02232 Carbuncle of back [any part, except buttock]: Secondary | ICD-10-CM

## 2012-12-21 MED ORDER — CEPHALEXIN 500 MG PO CAPS: 500.0000 mg | ORAL_CAPSULE | Freq: Three times a day (TID) | ORAL | Status: DC

## 2012-12-21 NOTE — Patient Instructions (Signed)
Abscess An abscess is an infected area that contains a collection of pus and debris.It can occur in almost any part of the body. An abscess is also known as a furuncle or boil. CAUSES  An abscess occurs when tissue gets infected. This can occur from blockage of oil or sweat glands, infection of hair follicles, or a minor injury to the skin. As the body tries to fight the infection, pus collects in the area and creates pressure under the skin. This pressure causes pain. People with weakened immune systems have difficulty fighting infections and get certain abscesses more often.  SYMPTOMS Usually an abscess develops on the skin and becomes a painful mass that is red, warm, and tender. If the abscess forms under the skin, you may feel a moveable soft area under the skin. Some abscesses break open (rupture) on their own, but most will continue to get worse without care. The infection can spread deeper into the body and eventually into the bloodstream, causing you to feel ill.  DIAGNOSIS  Your caregiver will take your medical history and perform a physical exam. A sample of fluid may also be taken from the abscess to determine what is causing your infection. TREATMENT  Your caregiver may prescribe antibiotic medicines to fight the infection. However, taking antibiotics alone usually does not cure an abscess. Your caregiver may need to make a small cut (incision) in the abscess to drain the pus. In some cases, gauze is packed into the abscess to reduce pain and to continue draining the area. HOME CARE INSTRUCTIONS   Only take over-the-counter or prescription medicines for pain, discomfort, or fever as directed by your caregiver.  If you were prescribed antibiotics, take them as directed. Finish them even if you start to feel better.  If gauze is used, follow your caregiver's directions for changing the gauze.  To avoid spreading the infection:  Keep your draining abscess covered with a  bandage.  Wash your hands well.  Do not share personal care items, towels, or whirlpools with others.  Avoid skin contact with others.  Keep your skin and clothes clean around the abscess.  Keep all follow-up appointments as directed by your caregiver. SEEK MEDICAL CARE IF:   You have increased pain, swelling, redness, fluid drainage, or bleeding.  You have muscle aches, chills, or a general ill feeling.  You have a fever. MAKE SURE YOU:   Understand these instructions.  Will watch your condition.  Will get help right away if you are not doing well or get worse. Document Released: 03/05/2005 Document Revised: 11/25/2011 Document Reviewed: 08/08/2011 Exit[employer] Patient Information 2014 [employer].  

## 2012-12-21 NOTE — Progress Notes (Signed)
  Subjective:    Patient ID: Jakory Matsuo, male    DOB: [birth year], 79 y.o.   MRN: [id number]  HPI brough tin to have a boil checked on his back- noticed it one week ago- has gotten bigger and very sore to touch.    Review of Systems  All other systems reviewed and are negative.       Objective:   Physical Exam  Constitutional: He appears well-developed and well-nourished.  Cardiovascular: Normal rate, regular rhythm and normal heart sounds.   Pulmonary/Chest: Effort normal and breath sounds normal.  Skin:      3cm erythematous fluid filled tneder lesion mid lower back    Procedure: Lidociane 25 with epi 2cc local Cleaned with betadine #15 blade Yellowish excudate extracted Cleaned and dressing applied      Assessment & Plan:  1. Carbuncle of back *change dressing BID Keep clean and dry - cephALEXin (KEFLEX) 500 MG capsule; Take 1 capsule (500 mg total) by mouth 3 (three) times daily.  Dispense: 30 capsule; Refill: 0 Mary-Margaret Daphine Deutscher, FNP

## 2012-12-21 NOTE — Addendum Note (Signed)
Addended by: Bennie Pierini on: 12/21/2012 12:06 PM   Modules accepted: Orders

## 2012-12-24 LAB — WOUND CULTURE: Gram Stain: NONE SEEN

## 2013-01-04 ENCOUNTER — Encounter: Payer: Self-pay | Admitting: Family Medicine

## 2013-01-04 ENCOUNTER — Ambulatory Visit (INDEPENDENT_AMBULATORY_CARE_PROVIDER_SITE_OTHER): Payer: Medicare Other | Admitting: Family Medicine

## 2013-01-04 ENCOUNTER — Ambulatory Visit: Payer: Medicare Other | Admitting: Family Medicine

## 2013-01-04 VITALS — BP 99/58 | HR 74 | Temp 97.9°F | Wt 221.0 lb

## 2013-01-04 DIAGNOSIS — N189 Chronic kidney disease, unspecified: Secondary | ICD-10-CM | POA: Insufficient documentation

## 2013-01-04 DIAGNOSIS — D473 Essential (hemorrhagic) thrombocythemia: Secondary | ICD-10-CM

## 2013-01-04 DIAGNOSIS — F329 Major depressive disorder, single episode, unspecified: Secondary | ICD-10-CM

## 2013-01-04 DIAGNOSIS — D649 Anemia, unspecified: Secondary | ICD-10-CM | POA: Insufficient documentation

## 2013-01-04 DIAGNOSIS — I5030 Unspecified diastolic (congestive) heart failure: Secondary | ICD-10-CM | POA: Insufficient documentation

## 2013-01-04 DIAGNOSIS — D75839 Thrombocytosis, unspecified: Secondary | ICD-10-CM | POA: Insufficient documentation

## 2013-01-04 DIAGNOSIS — I4891 Unspecified atrial fibrillation: Secondary | ICD-10-CM

## 2013-01-04 DIAGNOSIS — Z950 Presence of cardiac pacemaker: Secondary | ICD-10-CM | POA: Insufficient documentation

## 2013-01-04 DIAGNOSIS — I422 Other hypertrophic cardiomyopathy: Secondary | ICD-10-CM | POA: Insufficient documentation

## 2013-01-04 DIAGNOSIS — F3289 Other specified depressive episodes: Secondary | ICD-10-CM

## 2013-01-04 DIAGNOSIS — E538 Deficiency of other specified B group vitamins: Secondary | ICD-10-CM

## 2013-01-04 DIAGNOSIS — F319 Bipolar disorder, unspecified: Secondary | ICD-10-CM

## 2013-01-04 DIAGNOSIS — F32A Depression, unspecified: Secondary | ICD-10-CM | POA: Insufficient documentation

## 2013-01-04 DIAGNOSIS — Z7901 Long term (current) use of anticoagulants: Secondary | ICD-10-CM

## 2013-01-04 LAB — POCT INR: INR: 2.7

## 2013-01-04 NOTE — Progress Notes (Signed)
Patient ID: [name], male   DOB: 02-15-1925, 79 y.o.   MRN: [id number] SUBJECTIVE: CC: Chief Complaint  Patient presents with  . Follow-up    3 month follow up  needs protime reck back     HPI: Has been stable no new complaints, Here for follow up of his medical problems: Anticoagulation management,CKD, pacemaker, a fib, Diastolic heart failure, hypertrophic cardiomyopathy, acl tear. He did see the orthopedist and they said that they did not have anything to offer at this time for the ACL tear.  Past Medical History  Diagnosis Date  . Anemia   . Chronic constipation   . Bloating   . Colonic neoplasm   . Occult blood positive stool   . A-fib   . Hypertrophic cardiomyopathy   . Bipolar disorder   . Diastolic heart failure   . Pacemaker   . Depression   . Chronic kidney disease   . Thrombocytosis    Past Surgical History  Procedure Laterality Date  . Colonoscopy  10/02/2010    W/POLYP  . Fracture surgery    . Pacemaker insertion    . Cholecystectomy     History   Social History  . Marital Status: Widowed    Spouse Name: N/A    Number of Children: N/A  . Years of Education: N/A   Occupational History  . Not on file.   Social History Main Topics  . Smoking status: Former Smoker    Quit date: 10/05/1972  . Smokeless tobacco: Not on file  . Alcohol Use: No  . Drug Use: No  . Sexually Active: Not on file   Other Topics Concern  . Not on file   Social History Narrative  . No narrative on file   No family history on file. Current Outpatient Prescriptions on File Prior to Visit  Medication Sig Dispense Refill  . fenofibrate (TRICOR) 145 MG tablet Take 145 mg by mouth daily.       . furosemide (LASIX) 20 MG tablet Take 20-40 mg by mouth 2 (two) times daily. Take two tablets by mouth daily in the morning and one tablet in the evening      . gabapentin (NEURONTIN) 100 MG capsule Take 100 mg by mouth 2 (two) times daily.       Marland Kitchen HYDROcodone-acetaminophen  (NORCO) 5-325 MG per tablet TAKE ONE TABLET BY MOUTH TWICE DAILY AS NEEDED  30 tablet  1  . hydroxyurea (HYDREA) 500 MG capsule Take 500 mg by mouth every other day. May take with food to minimize GI side effects.      Marland Kitchen loratadine (CLARITIN) 10 MG tablet Take 10 mg by mouth daily.      . metoprolol tartrate (LOPRESSOR) 25 MG tablet Take 25 mg by mouth 2 (two) times daily.      . Multiple Vitamins-Minerals (CERTA-VITE SENIOR-LUTEIN PO) Take 1 tablet by mouth every morning.      . nitroGLYCERIN (NITRODUR - DOSED IN MG/24 HR) 0.2 mg/hr Place 1 patch onto the skin daily.      Marland Kitchen omeprazole (PRILOSEC) 20 MG capsule Take 20 mg by mouth daily.        . potassium chloride (KLOR-CON) 20 MEQ packet Take 20 mEq by mouth daily.       . potassium chloride SA (K-DUR,KLOR-CON) 20 MEQ tablet       . risperiDONE (RISPERDAL) 1 MG tablet Take 1 mg by mouth 2 (two) times daily.      Marland Kitchen senna (SENOKOT) 8.6  MG TABS Take 2 tablets by mouth.      . Tamsulosin HCl (FLOMAX) 0.4 MG CAPS Take 0.4 mg by mouth every evening.       . temazepam (RESTORIL) 7.5 MG capsule Take 1 capsule (7.5 mg total) by mouth every evening. 6pm  30 capsule  1  . traMADol (ULTRAM) 50 MG tablet       . vitamin B-12 (CYANOCOBALAMIN) 1000 MCG tablet Take 1,000 mcg by mouth daily.        Marland Kitchen warfarin (COUMADIN) 2 MG tablet Take 2 mg by mouth every evening. 6pm      . cephALEXin (KEFLEX) 500 MG capsule Take 1 capsule (500 mg total) by mouth 3 (three) times daily.  30 capsule  0   No current facility-administered medications on file prior to visit.   Allergies  Allergen Reactions  . Abilify (Aripiprazole)   . Demerol   . Morphine And Related     There is no immunization history on file for this patient. Prior to Admission medications   Medication Sig Start Date End Date Taking? Authorizing Provider  fenofibrate (TRICOR) 145 MG tablet Take 145 mg by mouth daily.  09/26/12  Yes Historical Provider, MD  furosemide (LASIX) 20 MG tablet Take 20-40 mg  by mouth 2 (two) times daily. Take two tablets by mouth daily in the morning and one tablet in the evening   Yes Historical Provider, MD  gabapentin (NEURONTIN) 100 MG capsule Take 100 mg by mouth 2 (two) times daily.    Yes Historical Provider, MD  HYDROcodone-acetaminophen (NORCO) 5-325 MG per tablet TAKE ONE TABLET BY MOUTH TWICE DAILY AS NEEDED 09/30/12  Yes Ernestina Penna, MD  hydroxyurea (HYDREA) 500 MG capsule Take 500 mg by mouth every other day. May take with food to minimize GI side effects.   Yes Historical Provider, MD  loratadine (CLARITIN) 10 MG tablet Take 10 mg by mouth daily.   Yes Historical Provider, MD  metoprolol tartrate (LOPRESSOR) 25 MG tablet Take 25 mg by mouth 2 (two) times daily.   Yes Historical Provider, MD  Multiple Vitamins-Minerals (CERTA-VITE SENIOR-LUTEIN PO) Take 1 tablet by mouth every morning.   Yes Historical Provider, MD  nitroGLYCERIN (NITRODUR - DOSED IN MG/24 HR) 0.2 mg/hr Place 1 patch onto the skin daily.   Yes Historical Provider, MD  omeprazole (PRILOSEC) 20 MG capsule Take 20 mg by mouth daily.     Yes Historical Provider, MD  potassium chloride (KLOR-CON) 20 MEQ packet Take 20 mEq by mouth daily.    Yes Historical Provider, MD  potassium chloride SA (K-DUR,KLOR-CON) 20 MEQ tablet  09/26/12  Yes Historical Provider, MD  risperiDONE (RISPERDAL) 1 MG tablet Take 1 mg by mouth 2 (two) times daily.   Yes Historical Provider, MD  senna (SENOKOT) 8.6 MG TABS Take 2 tablets by mouth.   Yes Historical Provider, MD  Tamsulosin HCl (FLOMAX) 0.4 MG CAPS Take 0.4 mg by mouth every evening.    Yes Historical Provider, MD  temazepam (RESTORIL) 7.5 MG capsule Take 1 capsule (7.5 mg total) by mouth every evening. 6pm 11/12/12  Yes Ileana Ladd, MD  traMADol Janean Sark) 50 MG tablet  07/29/12  Yes Historical Provider, MD  vitamin B-12 (CYANOCOBALAMIN) 1000 MCG tablet Take 1,000 mcg by mouth daily.     Yes Historical Provider, MD  warfarin (COUMADIN) 2 MG tablet Take 2 mg by  mouth every evening. 6pm   Yes Historical Provider, MD  cephALEXin (KEFLEX) 500 MG capsule  Take 1 capsule (500 mg total) by mouth 3 (three) times daily. 12/21/12   Mary-Margaret Daphine Deutscher, FNP      ROS: As above in the HPI. All other systems are stable or negative.  OBJECTIVE: APPEARANCE:  Patient in no acute distress.The patient appeared well nourished and normally developed. Acyanotic. Waist: VITAL SIGNS:BP 99/58  Pulse 74  Temp(Src) 97.9 F (36.6 C) (Oral)  Wt 221 lb (100.245 kg)  BMI 28.36 kg/m2 WM elderly ambulating with a walker rolling type.   SKIN: warm and  Dry without overt rashes, tattoos and scars  HEAD and Neck: without JVD, Head and scalp: normal Eyes:No scleral icterus. Fundi normal, eye movements normal. Ears: Auricle normal, canal normal, Tympanic membranes normal, insufflation normal. Nose: normal Throat: normal Neck & thyroid: normal  CHEST & LUNGS: Chest wall: normal Lungs: Clear  CVS: Reveals the PMI to be normally located. Regular rhythm, First and Second Heart sounds are normal,  absence of murmurs, rubs or gallops. Peripheral vasculature: Radial pulses: normal Dorsal pedis pulses: normal Posterior pulses: normal  ABDOMEN:  Appearance: normal Benign, no organomegaly, no masses, no Abdominal Aortic enlargement. No Guarding , no rebound. No Bruits. Bowel sounds: normal  RECTAL: N/A GU: N/A  EXTREMETIES: trace edematous. Right knee arthritic and unsteady  NEUROLOGIC: oriented to ,place and person; nonfocal.  ASSESSMENT: Atrial fibrillation, Active - Plan: CMP14+EGFR, NMR, lipoprofile, POCT INR, POCT INR  Atrial fibrillation - Plan: CMP14+EGFR, NMR, lipoprofile, POCT INR, POCT INR  Long term (current) use of anticoagulants  CKD (chronic kidney disease), unspecified stage - Plan: NMR, lipoprofile  Anemia - Plan: CBC with Differential  Hypertrophic cardiomyopathy - Plan: CMP14+EGFR, NMR, lipoprofile  Bipolar disorder, unspecified -  Plan: CMP14+EGFR  Diastolic heart failure, unspecified failure chronicity - Plan: CMP14+EGFR, NMR, lipoprofile  Cardiac pacemaker in situ  Thrombocytosis - Plan: CBC with Differential  Depression - Plan: CMP14+EGFR  B12 deficiency - Plan: Vitamin B12  PLAN:  Orders Placed This Encounter  Procedures  . CBC with Differential  . CMP14+EGFR  . NMR, lipoprofile  . Vitamin B12  . POCT INR    Standing Status: Standing     Number of Occurrences: 52     Standing Expiration Date: 01/04/2014    Results for orders placed in visit on 01/04/13  POCT INR      Result Value Range   INR 2.7      No change in anticoagulation.  Return in about 3 months (around 04/06/2013) for Recheck medical problems.   Signed the forms for [facility]. Reviewed meds. No changes. Nothing new to offer.   Jaquae Rieves P. Modesto Charon, M.D.

## 2013-01-06 ENCOUNTER — Other Ambulatory Visit: Payer: Self-pay | Admitting: Family Medicine

## 2013-01-06 DIAGNOSIS — E785 Hyperlipidemia, unspecified: Secondary | ICD-10-CM

## 2013-01-06 LAB — NMR, LIPOPROFILE
Cholesterol: 161 mg/dL (ref <200)
HDL Cholesterol by NMR: 30 mg/dL — ABNORMAL LOW (ref >=40)
HDL Particle Number: 26.9 umol/L — ABNORMAL LOW (ref >=30.5)
LDL Particle Number: 1620 nmol/L — ABNORMAL HIGH (ref <1000)
LDL Size: 21.3 nm (ref >20.5)
LDLC SERPL CALC-MCNC: 98 mg/dL (ref <100)
LP-IR Score: 47 — ABNORMAL HIGH (ref <=45)
Small LDL Particle Number: 835 nmol/L — ABNORMAL HIGH (ref <=527)
Triglycerides by NMR: 165 mg/dL — ABNORMAL HIGH (ref <150)

## 2013-01-06 LAB — CBC WITH DIFFERENTIAL/PLATELET
Basophils Absolute: 0.3 10*3/uL — ABNORMAL HIGH (ref 0.0–0.2)
Basos: 4 % — ABNORMAL HIGH (ref 0–3)
Eos: 3 % (ref 0–5)
Eosinophils Absolute: 0.2 10*3/uL (ref 0.0–0.4)
HCT: 38.3 % (ref 37.5–51.0)
Hemoglobin: 12.7 g/dL (ref 12.6–17.7)
Immature Grans (Abs): 0.1 10*3/uL (ref 0.0–0.1)
Immature Granulocytes: 1 % (ref 0–2)
Lymphocytes Absolute: 1.5 10*3/uL (ref 0.7–3.1)
Lymphs: 21 % (ref 14–46)
MCH: 31.3 pg (ref 26.6–33.0)
MCHC: 33.2 g/dL (ref 31.5–35.7)
MCV: 94 fL (ref 79–97)
Monocytes Absolute: 0.8 10*3/uL (ref 0.1–0.9)
Monocytes: 11 % (ref 4–12)
Neutrophils Absolute: 4.4 10*3/uL (ref 1.4–7.0)
Neutrophils Relative %: 60 % (ref 40–74)
RBC: 4.06 x10E6/uL — ABNORMAL LOW (ref 4.14–5.80)
RDW: 15.3 % (ref 12.3–15.4)
WBC: 7.3 10*3/uL (ref 3.4–10.8)

## 2013-01-06 LAB — CMP14+EGFR
ALT: 14 IU/L (ref 0–44)
AST: 25 IU/L (ref 0–40)
Albumin/Globulin Ratio: 1.6 (ref 1.1–2.5)
Albumin: 4.1 g/dL (ref 3.5–4.7)
Alkaline Phosphatase: 37 IU/L — ABNORMAL LOW (ref 39–117)
BUN/Creatinine Ratio: 12 (ref 10–22)
BUN: 22 mg/dL (ref 8–27)
CO2: 26 mmol/L (ref 18–29)
Calcium: 9.3 mg/dL (ref 8.6–10.2)
Chloride: 104 mmol/L (ref 97–108)
Creatinine, Ser: 1.83 mg/dL — ABNORMAL HIGH (ref 0.76–1.27)
GFR calc Af Amer: 38 mL/min/{1.73_m2} — ABNORMAL LOW (ref >59)
GFR calc non Af Amer: 33 mL/min/{1.73_m2} — ABNORMAL LOW (ref >59)
Globulin, Total: 2.6 g/dL (ref 1.5–4.5)
Glucose: 83 mg/dL (ref 65–99)
Potassium: 4.9 mmol/L (ref 3.5–5.2)
Sodium: 144 mmol/L (ref 134–144)
Total Bilirubin: 0.5 mg/dL (ref 0.0–1.2)
Total Protein: 6.7 g/dL (ref 6.0–8.5)

## 2013-01-06 LAB — VITAMIN B12: Vitamin B-12: 827 pg/mL (ref 211–946)

## 2013-01-06 MED ORDER — FENOFIBRATE 160 MG PO TABS: 145.0000 mg | ORAL_TABLET | Freq: Every day | ORAL | Status: DC

## 2013-01-14 ENCOUNTER — Telehealth: Payer: Self-pay | Admitting: *Deleted

## 2013-01-14 MED ORDER — TEMAZEPAM 7.5 MG PO CAPS: 7.5000 mg | ORAL_CAPSULE | Freq: Every evening | ORAL | Status: DC

## 2013-01-14 NOTE — Telephone Encounter (Signed)
Patient last seen on 01-04-13. Please advise. If approved please route to Pool A so nurse can phone in to [facility] at [phone]

## 2013-01-27 ENCOUNTER — Ambulatory Visit (INDEPENDENT_AMBULATORY_CARE_PROVIDER_SITE_OTHER): Payer: Medicare Other | Admitting: Pharmacist

## 2013-01-27 DIAGNOSIS — Z7901 Long term (current) use of anticoagulants: Secondary | ICD-10-CM

## 2013-01-27 DIAGNOSIS — I4891 Unspecified atrial fibrillation: Secondary | ICD-10-CM

## 2013-01-27 LAB — POCT INR: INR: 1.8

## 2013-01-27 NOTE — Patient Instructions (Signed)
Anticoagulation Dose Instructions as of 01/27/2013     [name] Tue Wed Thu Fri Sat   New Dose 2 mg 2 mg 2 mg 4 mg 2 mg 2 mg 2 mg    Description       Take 4mg  today (thursday 01/27/2013) then restart regular dose of 2mg  daily except 4mg  on wednesdays.       INR was 1.8 today

## 2013-01-28 ENCOUNTER — Other Ambulatory Visit: Payer: Self-pay

## 2013-01-28 MED ORDER — TEMAZEPAM 7.5 MG PO CAPS: 7.5000 mg | ORAL_CAPSULE | Freq: Every evening | ORAL | Status: DC

## 2013-02-04 NOTE — Telephone Encounter (Signed)
URGENT

## 2013-02-08 ENCOUNTER — Other Ambulatory Visit: Payer: Self-pay

## 2013-02-08 MED ORDER — TEMAZEPAM 7.5 MG PO CAPS: 7.5000 mg | ORAL_CAPSULE | Freq: Every evening | ORAL | Status: DC

## 2013-02-08 NOTE — Telephone Encounter (Signed)
Called [facility] and advised Olegario Messier that rx for temazepam was faxed to [facility]

## 2013-02-08 NOTE — Telephone Encounter (Signed)
[name] will you keep trying to call this med in- pharm # has been busy multiple times

## 2013-02-21 ENCOUNTER — Encounter: Payer: Self-pay | Admitting: *Deleted

## 2013-02-28 ENCOUNTER — Ambulatory Visit (INDEPENDENT_AMBULATORY_CARE_PROVIDER_SITE_OTHER): Payer: Medicare Other | Admitting: Pharmacist

## 2013-02-28 ENCOUNTER — Telehealth: Payer: Self-pay | Admitting: Family Medicine

## 2013-02-28 DIAGNOSIS — Z23 Encounter for immunization: Secondary | ICD-10-CM

## 2013-02-28 DIAGNOSIS — I4891 Unspecified atrial fibrillation: Secondary | ICD-10-CM

## 2013-02-28 DIAGNOSIS — Z7901 Long term (current) use of anticoagulants: Secondary | ICD-10-CM

## 2013-02-28 LAB — POCT INR: INR: 1.9

## 2013-02-28 MED ORDER — WARFARIN SODIUM 2 MG PO TABS: ORAL_TABLET | ORAL | Status: DC

## 2013-02-28 NOTE — Patient Instructions (Signed)
Anticoagulation Dose Instructions as of 02/28/2013     [name] Tue Wed Thu Fri Sat   New Dose 2 mg 2 mg 2 mg 4 mg 2 mg 2 mg 2 mg    Description       Start 2mg  daily except 4mg  on Wednesdays and Saturdays     INR was 1.9 today

## 2013-02-28 NOTE — Telephone Encounter (Signed)
Dosing corrected and new rx faxed to [facility].

## 2013-03-13 ENCOUNTER — Emergency Department (HOSPITAL_COMMUNITY): Payer: Medicare Other

## 2013-03-13 ENCOUNTER — Inpatient Hospital Stay (HOSPITAL_COMMUNITY)
Admission: EM | Admit: 2013-03-13 | Discharge: 2013-03-15 | DRG: 313 | Disposition: A | Discharge status: Home / Self Care | Payer: Medicare Other | Attending: Cardiovascular Disease | Admitting: Cardiovascular Disease

## 2013-03-13 ENCOUNTER — Encounter (HOSPITAL_COMMUNITY): Payer: Self-pay

## 2013-03-13 DIAGNOSIS — R0600 Dyspnea, unspecified: Secondary | ICD-10-CM

## 2013-03-13 DIAGNOSIS — I200 Unstable angina: Secondary | ICD-10-CM

## 2013-03-13 DIAGNOSIS — F319 Bipolar disorder, unspecified: Secondary | ICD-10-CM | POA: Diagnosis present

## 2013-03-13 DIAGNOSIS — N183 Chronic kidney disease, stage 3 unspecified: Secondary | ICD-10-CM | POA: Diagnosis present

## 2013-03-13 DIAGNOSIS — I4891 Unspecified atrial fibrillation: Secondary | ICD-10-CM | POA: Diagnosis present

## 2013-03-13 DIAGNOSIS — F3289 Other specified depressive episodes: Secondary | ICD-10-CM | POA: Diagnosis present

## 2013-03-13 DIAGNOSIS — Z950 Presence of cardiac pacemaker: Secondary | ICD-10-CM

## 2013-03-13 DIAGNOSIS — R079 Chest pain, unspecified: Secondary | ICD-10-CM | POA: Diagnosis present

## 2013-03-13 DIAGNOSIS — M25512 Pain in left shoulder: Secondary | ICD-10-CM

## 2013-03-13 DIAGNOSIS — I422 Other hypertrophic cardiomyopathy: Secondary | ICD-10-CM | POA: Diagnosis present

## 2013-03-13 DIAGNOSIS — I5021 Acute systolic (congestive) heart failure: Secondary | ICD-10-CM

## 2013-03-13 DIAGNOSIS — Z7901 Long term (current) use of anticoagulants: Secondary | ICD-10-CM

## 2013-03-13 DIAGNOSIS — Z85038 Personal history of other malignant neoplasm of large intestine: Secondary | ICD-10-CM

## 2013-03-13 DIAGNOSIS — Z87891 Personal history of nicotine dependence: Secondary | ICD-10-CM

## 2013-03-13 DIAGNOSIS — R0789 Other chest pain: Principal | ICD-10-CM | POA: Diagnosis present

## 2013-03-13 DIAGNOSIS — F329 Major depressive disorder, single episode, unspecified: Secondary | ICD-10-CM | POA: Diagnosis present

## 2013-03-13 DIAGNOSIS — N189 Chronic kidney disease, unspecified: Secondary | ICD-10-CM | POA: Diagnosis present

## 2013-03-13 DIAGNOSIS — R0602 Shortness of breath: Secondary | ICD-10-CM | POA: Diagnosis present

## 2013-03-13 DIAGNOSIS — I509 Heart failure, unspecified: Secondary | ICD-10-CM | POA: Diagnosis present

## 2013-03-13 HISTORY — DX: Chronic kidney disease, stage 3 (moderate): N18.3

## 2013-03-13 LAB — BASIC METABOLIC PANEL
CO2: 30 mEq/L (ref 19–32)
Chloride: 102 mEq/L (ref 96–112)
Creatinine, Ser: 1.66 mg/dL — ABNORMAL HIGH (ref 0.50–1.35)
GFR calc Af Amer: 41 mL/min — ABNORMAL LOW (ref >90)
GFR calc non Af Amer: 36 mL/min — ABNORMAL LOW (ref >90)
Potassium: 4 mEq/L (ref 3.5–5.1)

## 2013-03-13 LAB — CBC
MCHC: 31.9 g/dL (ref 30.0–36.0)
MCV: 98 fL (ref 78.0–100.0)
Platelets: 565 10*3/uL — ABNORMAL HIGH (ref 150–400)
RBC: 4.06 MIL/uL — ABNORMAL LOW (ref 4.22–5.81)
RDW: 15.2 % (ref 11.5–15.5)
WBC: 7.4 10*3/uL (ref 4.0–10.5)

## 2013-03-13 LAB — PRO B NATRIURETIC PEPTIDE: Pro B Natriuretic peptide (BNP): 2051 pg/mL — ABNORMAL HIGH (ref 0–450)

## 2013-03-13 LAB — PROTIME-INR: INR: 3.77 — ABNORMAL HIGH (ref 0.00–1.49)

## 2013-03-13 LAB — POCT I-STAT TROPONIN I: Troponin i, poc: 0.04 ng/mL (ref 0.00–0.08)

## 2013-03-13 MED ORDER — NITROGLYCERIN 0.4 MG SL SUBL: 0.4000 mg | SUBLINGUAL_TABLET | SUBLINGUAL | Status: DC | PRN

## 2013-03-13 MED ORDER — HYDROXYUREA 500 MG PO CAPS
500.0000 mg | ORAL_CAPSULE | ORAL | Status: DC
  Administered 2013-03-14: 500 mg via ORAL
  Filled 2013-03-13: qty 1

## 2013-03-13 MED ORDER — ASPIRIN EC 325 MG PO TBEC
325.0000 mg | DELAYED_RELEASE_TABLET | Freq: Once | ORAL | Status: AC
  Administered 2013-03-13: 325 mg via ORAL
  Filled 2013-03-13: qty 1

## 2013-03-13 MED ORDER — PANTOPRAZOLE SODIUM 40 MG PO TBEC
40.0000 mg | DELAYED_RELEASE_TABLET | Freq: Every day | ORAL | Status: DC
  Administered 2013-03-14 – 2013-03-15 (×2): 40 mg via ORAL
  Filled 2013-03-13 (×2): qty 1

## 2013-03-13 MED ORDER — TEMAZEPAM 7.5 MG PO CAPS: 7.5000 mg | ORAL_CAPSULE | Freq: Every evening | ORAL | Status: DC

## 2013-03-13 MED ORDER — ONDANSETRON HCL 4 MG/2ML IJ SOLN: 4.0000 mg | Freq: Four times a day (QID) | INTRAMUSCULAR | Status: DC | PRN

## 2013-03-13 MED ORDER — GABAPENTIN 100 MG PO CAPS
100.0000 mg | ORAL_CAPSULE | Freq: Two times a day (BID) | ORAL | Status: DC
  Administered 2013-03-14 – 2013-03-15 (×4): 100 mg via ORAL
  Filled 2013-03-13 (×6): qty 1

## 2013-03-13 MED ORDER — ZOLPIDEM TARTRATE 5 MG PO TABS
5.0000 mg | ORAL_TABLET | Freq: Every day | ORAL | Status: DC
  Administered 2013-03-14: 5 mg via ORAL
  Filled 2013-03-13: qty 1

## 2013-03-13 MED ORDER — NITROGLYCERIN 0.2 MG/HR TD PT24
0.2000 mg | MEDICATED_PATCH | TRANSDERMAL | Status: DC
  Administered 2013-03-14: 0.2 mg via TRANSDERMAL
  Filled 2013-03-13 (×3): qty 1

## 2013-03-13 MED ORDER — METOPROLOL TARTRATE 25 MG PO TABS
25.0000 mg | ORAL_TABLET | Freq: Two times a day (BID) | ORAL | Status: DC
  Administered 2013-03-14 – 2013-03-15 (×4): 25 mg via ORAL
  Filled 2013-03-13 (×6): qty 1

## 2013-03-13 MED ORDER — ACETAMINOPHEN 325 MG PO TABS: 650.0000 mg | ORAL_TABLET | ORAL | Status: DC | PRN

## 2013-03-13 MED ORDER — ATORVASTATIN CALCIUM 40 MG PO TABS
40.0000 mg | ORAL_TABLET | Freq: Every day | ORAL | Status: DC
  Administered 2013-03-14: 40 mg via ORAL
  Filled 2013-03-13 (×2): qty 1

## 2013-03-13 MED ORDER — RISPERIDONE 1 MG PO TABS
1.0000 mg | ORAL_TABLET | Freq: Two times a day (BID) | ORAL | Status: DC
  Administered 2013-03-14 – 2013-03-15 (×3): 1 mg via ORAL
  Filled 2013-03-13 (×6): qty 1

## 2013-03-13 MED ORDER — ASPIRIN EC 81 MG PO TBEC
81.0000 mg | DELAYED_RELEASE_TABLET | Freq: Every day | ORAL | Status: DC
  Administered 2013-03-14 – 2013-03-15 (×2): 81 mg via ORAL
  Filled 2013-03-13 (×2): qty 1

## 2013-03-13 MED ORDER — TAMSULOSIN HCL 0.4 MG PO CAPS
0.4000 mg | ORAL_CAPSULE | Freq: Every evening | ORAL | Status: DC
  Administered 2013-03-14: 0.4 mg via ORAL
  Filled 2013-03-13 (×2): qty 1

## 2013-03-13 NOTE — ED Notes (Signed)
Patient is wearing a nitro patch on his right shoulder

## 2013-03-13 NOTE — ED Provider Notes (Signed)
CSN: [id number]     Arrival date & time 03/13/13  2027 History   First MD Initiated Contact with Patient 03/13/13 2028     Chief Complaint  Patient presents with  . Shoulder Pain    Left   (Consider location/radiation/quality/duration/timing/severity/associated sxs/prior Treatment) Patient is a 79 y.o. male presenting with shoulder pain.  Shoulder Pain This is a new problem. Episode onset: 3 days ago. The problem occurs constantly. The problem has been unchanged. Pertinent negatives include no abdominal pain, arthralgias, chest pain, chills, congestion, coughing, diaphoresis, fever, headaches, nausea, rash, sore throat, vomiting or weakness. Associated symptoms comments: Shortness of breath . The symptoms are aggravated by twisting. He has tried nothing for the symptoms.    Past Medical History  Diagnosis Date  . Anemia   . Chronic constipation   . Bloating   . Colonic neoplasm   . Occult blood positive stool   . A-fib   . Hypertrophic cardiomyopathy   . Bipolar disorder   . Diastolic heart failure   . Pacemaker   . Depression   . Chronic kidney disease   . Thrombocytosis    Past Surgical History  Procedure Laterality Date  . Colonoscopy  10/02/2010    W/POLYP  . Fracture surgery    . Pacemaker insertion    . Cholecystectomy     History reviewed. No pertinent family history. History  Substance Use Topics  . Smoking status: Former Smoker    Quit date: 10/05/1972  . Smokeless tobacco: Not on file  . Alcohol Use: No    Review of Systems  Constitutional: Negative for fever, chills and diaphoresis.  HENT: Negative for congestion, sore throat and rhinorrhea.   Eyes: Negative for photophobia and visual disturbance.  Respiratory: Negative for cough and shortness of breath.   Cardiovascular: Negative for chest pain and leg swelling.  Gastrointestinal: Negative for nausea, vomiting, abdominal pain, diarrhea and constipation.  Endocrine: Negative for polydipsia and  polyuria.  Genitourinary: Negative for dysuria and hematuria.  Musculoskeletal: Negative for back pain and arthralgias.  Skin: Negative for color change and rash.  Neurological: Negative for dizziness, syncope, weakness, light-headedness and headaches.  Hematological: Negative for adenopathy. Does not bruise/bleed easily.  All other systems reviewed and are negative.    Allergies  Abilify; Demerol; and Morphine and related  Home Medications   No current outpatient prescriptions on file. BP 133/79  Pulse 75  Temp(Src) 97.5 F (36.4 C) (Oral)  Resp 19  Ht 6\' 3"  (1.905 m)  Wt 216 lb (97.977 kg)  BMI 27 kg/m2  SpO2 99% Physical Exam  Vitals reviewed. Constitutional: He is oriented to person, place, and time. He appears well-developed and well-nourished.  HENT:  Head: Normocephalic and atraumatic.  Eyes: Conjunctivae and EOM are normal.  Neck: Normal range of motion. Neck supple.  Cardiovascular: Normal rate, regular rhythm and normal heart sounds.   Pulmonary/Chest: Effort normal and breath sounds normal. No respiratory distress.  Abdominal: He exhibits no distension. There is no tenderness. There is no rebound and no guarding.  Musculoskeletal:       Left shoulder: He exhibits decreased range of motion (secondary to pain), tenderness and bony tenderness.  Neurological: He is alert and oriented to person, place, and time.  Skin: Skin is warm and dry.    ED Course  Procedures (including critical care time) Labs Review Labs Reviewed  CBC - Abnormal; Notable for the following:    RBC 4.06 (*)    Hemoglobin 12.7 (*)  Platelets 565 (*)    All other components within normal limits  BASIC METABOLIC PANEL - Abnormal; Notable for the following:    Glucose, Bld 103 (*)    Creatinine, Ser 1.66 (*)    GFR calc non Af Amer 36 (*)    GFR calc Af Amer 41 (*)    All other components within normal limits  PRO B NATRIURETIC PEPTIDE - Abnormal; Notable for the following:    Pro B  Natriuretic peptide (BNP) 2051.0 (*)    All other components within normal limits  PROTIME-INR - Abnormal; Notable for the following:    Prothrombin Time 35.8 (*)    INR 3.77 (*)    All other components within normal limits  URINALYSIS, ROUTINE W REFLEX MICROSCOPIC  POCT I-STAT TROPONIN I   Imaging Review Dg Chest Portable 1 View  03/13/2013   CLINICAL DATA:  Shortness of breath and chest pain  EXAM: PORTABLE CHEST - 1 VIEW  COMPARISON:  09/16/2012  FINDINGS: Cardiac shadow remains enlarged. A pacing device is again seen. The lungs are clear bilaterally.  IMPRESSION: No active disease.   Electronically Signed   By: Alcide Clever M.D.   On: 03/13/2013 21:05   Dg Shoulder Left  03/13/2013   *RADIOLOGY REPORT*  Clinical Data: Left shoulder pain, no trauma.  LEFT SHOULDER - 2+ VIEW  Comparison: None available at time of study interpretation.  Findings: The humeral head is well-formed and located.  The subacromial, glenohumeral and acromioclavicular joint spaces are intact.  No destructive bony lesions.  Soft tissue planes are non- suspicious.  Partially imaged remote left posterior rib fracture.  Cardiac pacemaker partially imaged.  IMPRESSION: No acute fracture deformity, dislocation or advanced degenerative change for age.   Original Report Authenticated By: Awilda Metro    Date: 03/13/2013  Rate: 75  Rhythm: atrial paced  QRS Axis: normal  Intervals: paced rhythm, qt normal  ST/T Wave abnormalities: indeterminate  Conduction Disutrbances:paced  Narrative Interpretation:   Old EKG Reviewed: none available   MDM   1. Dyspnea   2. CHF (congestive heart failure), acute, systolic   3. Shoulder pain, left    79 y.o. male  with pertinent PMH of anemia, bipolar do, diastolic heart failure, ckd, afib presents with dyspnea and L shoulder pain x 3 days.  Pt has no reported ho CAD, has not seen a cardiologist in years.  Initial EKG with paced rhythm, no other for comparison.  Pt had nitro patch  on.  Physical exam as above with L shoulder tenderness.  CXR unremarkable.  Troponin negative, BNP elevated at 2000.  Consulted cardiology for admission.  Stable sats, and pt not in chest pain so deferred nitro or lasix to cardiology.  Labs and imaging as above reviewed by myself and attending,Dr. Redgie Grayer, with whom case was discussed.   1. Dyspnea   2. CHF (congestive heart failure), acute, systolic   3. Shoulder pain, left         Noel Gerold, MD 03/13/13 [phone]

## 2013-03-13 NOTE — ED Notes (Signed)
Patient brought in by [facility], Pt c/o Left shoulder pain x 3 days

## 2013-03-13 NOTE — ED Notes (Addendum)
Justice Britain (daughter) [phone]  Waymond Cera [phone]

## 2013-03-14 ENCOUNTER — Encounter (HOSPITAL_COMMUNITY): Payer: Self-pay | Admitting: Cardiology

## 2013-03-14 DIAGNOSIS — I4891 Unspecified atrial fibrillation: Secondary | ICD-10-CM

## 2013-03-14 DIAGNOSIS — R0609 Other forms of dyspnea: Secondary | ICD-10-CM

## 2013-03-14 DIAGNOSIS — I369 Nonrheumatic tricuspid valve disorder, unspecified: Secondary | ICD-10-CM

## 2013-03-14 DIAGNOSIS — R079 Chest pain, unspecified: Secondary | ICD-10-CM | POA: Insufficient documentation

## 2013-03-14 DIAGNOSIS — Z950 Presence of cardiac pacemaker: Secondary | ICD-10-CM

## 2013-03-14 DIAGNOSIS — R0602 Shortness of breath: Secondary | ICD-10-CM | POA: Diagnosis present

## 2013-03-14 LAB — LIPID PANEL
HDL: 31 mg/dL — ABNORMAL LOW (ref >39)
LDL Cholesterol: 106 mg/dL — ABNORMAL HIGH (ref 0–99)
Total CHOL/HDL Ratio: 5 RATIO
VLDL: 17 mg/dL (ref 0–40)

## 2013-03-14 LAB — CBC
MCH: 31.4 pg (ref 26.0–34.0)
MCHC: 32.6 g/dL (ref 30.0–36.0)
Platelets: 528 10*3/uL — ABNORMAL HIGH (ref 150–400)
RBC: 3.85 MIL/uL — ABNORMAL LOW (ref 4.22–5.81)
WBC: 8.1 10*3/uL (ref 4.0–10.5)

## 2013-03-14 LAB — BASIC METABOLIC PANEL
Calcium: 8.5 mg/dL (ref 8.4–10.5)
Creatinine, Ser: 1.6 mg/dL — ABNORMAL HIGH (ref 0.50–1.35)
GFR calc Af Amer: 43 mL/min — ABNORMAL LOW (ref >90)
GFR calc non Af Amer: 37 mL/min — ABNORMAL LOW (ref >90)
Glucose, Bld: 95 mg/dL (ref 70–99)
Sodium: 143 mEq/L (ref 135–145)

## 2013-03-14 LAB — URINALYSIS, ROUTINE W REFLEX MICROSCOPIC
Bilirubin Urine: NEGATIVE
Ketones, ur: NEGATIVE mg/dL
Leukocytes, UA: NEGATIVE
Nitrite: NEGATIVE
Specific Gravity, Urine: 1.017 (ref 1.005–1.030)
Urobilinogen, UA: 0.2 mg/dL (ref 0.0–1.0)
pH: 5.5 (ref 5.0–8.0)

## 2013-03-14 LAB — MRSA PCR SCREENING: MRSA by PCR: NEGATIVE

## 2013-03-14 LAB — TROPONIN I: Troponin I: <0.3 ng/mL (ref <0.30)

## 2013-03-14 MED ORDER — FENOFIBRATE 160 MG PO TABS
160.0000 mg | ORAL_TABLET | Freq: Every day | ORAL | Status: DC
  Administered 2013-03-14 – 2013-03-15 (×2): 160 mg via ORAL
  Filled 2013-03-14 (×2): qty 1

## 2013-03-14 MED ORDER — POTASSIUM CHLORIDE CRYS ER 20 MEQ PO TBCR
20.0000 meq | EXTENDED_RELEASE_TABLET | Freq: Every day | ORAL | Status: DC
  Administered 2013-03-14 – 2013-03-15 (×2): 20 meq via ORAL
  Filled 2013-03-14 (×3): qty 1

## 2013-03-14 MED ORDER — SENNA 8.6 MG PO TABS
2.0000 | ORAL_TABLET | Freq: Every day | ORAL | Status: DC
  Administered 2013-03-14: 17.2 mg via ORAL
  Filled 2013-03-14 (×2): qty 2

## 2013-03-14 MED ORDER — FUROSEMIDE 40 MG PO TABS
40.0000 mg | ORAL_TABLET | Freq: Two times a day (BID) | ORAL | Status: DC
  Administered 2013-03-14 – 2013-03-15 (×3): 40 mg via ORAL
  Filled 2013-03-14 (×5): qty 1

## 2013-03-14 MED ORDER — POTASSIUM CHLORIDE 20 MEQ PO PACK: 20.0000 meq | PACK | Freq: Every day | ORAL | Status: DC

## 2013-03-14 MED ORDER — GUAIFENESIN ER 600 MG PO TB12
600.0000 mg | ORAL_TABLET | Freq: Two times a day (BID) | ORAL | Status: DC
  Administered 2013-03-14 – 2013-03-15 (×3): 600 mg via ORAL
  Filled 2013-03-14 (×5): qty 1

## 2013-03-14 NOTE — Progress Notes (Signed)
Clinical Social Work Department BRIEF PSYCHOSOCIAL ASSESSMENT 03/14/2013  Patient:  [name], [name]     Account Number:  [id number]     Admit date:  03/13/2013  Clinical Social Worker:  Carren Rang  Date/Time:  03/14/2013 01:50 PM  Referred by:  RN  Date Referred:  03/14/2013 Referred for  SNF Placement   Other Referral:   Interview type:  Family Other interview type:   CSW spoke to sister in room while patient was not in the room.    PSYCHOSOCIAL DATA Living Status:  FACILITY Admitted from facility:  OTHER Level of care:  Assisted Living Primary support name:  [name] [phone] Primary support relationship to patient:  SIBLING Degree of support available:   Good    CURRENT CONCERNS Current Concerns  Post-Acute Placement   Other Concerns:    SOCIAL WORK ASSESSMENT / PLAN CSW received referral from RN that patient was from a SNF- CSW went into room, patient was currently not in the room at the time, patient's sister was in the room at the time. CSW went into room and introduced self and explained reason for visit. Sister stated that patient lives in an assisted living facility in [place], [place], [place]. CSW explained that csw is waiting for PT to see patient and their recommendation. CSW will complete FL2 and will continue to assist with dc plans, whether its back to ALF- or needing a short term SNF-   Assessment/plan status:  Psychosocial Support/Ongoing Assessment of Needs Other assessment/ plan:   Information/referral to community resources:   snf/alf info    PATIENT'S/FAMILY'S RESPONSE TO PLAN OF CARE: Patient's sister states that patient is from an ALF, [facility] in [place], [place].       [name], MSW, [name] [phone]

## 2013-03-14 NOTE — ED Provider Notes (Signed)
I saw and evaluated the patient, reviewed the resident's note and I agree with the findings and plan. I agree with the resident's interpretation EKG. Patient with CHF, dyspnea. Admit to cardiology for further evaluation. Patient stable throughout ED stay.  Darlys Gales, MD 03/14/13 [phone]

## 2013-03-14 NOTE — Progress Notes (Signed)
CSW spoke to Technical brewer at [facility]. They stated that patient can come back to facility, however if patient needs pt, they stated that they can only do 3 days a week and "the order would need to specify that." CSW awaiting pt evaluation and will update family and facility when new information arises.  Maree Krabbe, MSW, Theresia Majors [phone]

## 2013-03-14 NOTE — Progress Notes (Signed)
Subjective: Complains of chest pain when moving rt to Lt. Some mild SOB  Objective: Vital signs in last 24 hours: Temp:  [97.4 F (36.3 C)-98.4 F (36.9 C)] 98.4 F (36.9 C) (10/06 0419) Pulse Rate:  [73-77] 77 (10/06 0419) Resp:  [15-35] 18 (10/06 0419) BP: (120-178)/(64-79) 120/75 mmHg (10/06 0419) SpO2:  [95 %-100 %] 95 % (10/06 0419) Weight:  [216 lb (97.977 kg)-218 lb 0.6 oz (98.9 kg)] 218 lb 0.6 oz (98.9 kg) (10/05 2346) Weight change:  Last BM Date: 03/13/13 Intake/Output from previous day: -200 10/05 0701 - 10/06 0700 In: 340 [P.O.:340] Out: 300 [Urine:300] Intake/Output this shift: Total I/O In: -  Out: 250 [Urine:250]  PE: General:Pleasant affect, NAD Skin:Warm and dry, brisk capillary refill HEENT:normocephalic, sclera clear, mucus membranes moist, hard of hearing Neck:supple, no JVD Heart:S1S2 RRR without murmur, gallup, rub or click Lungs:clear without rales, rhonchi, or wheezes GEX:BMWUX, soft, non tender, + BS, do not palpate liver spleen or masses Ext: tr lower ext edema, Neuro:alert and oriented, MAE, follows commands, + facial symmetry  no significant chest wall pain with palpation.   Lab Results:  Recent Labs  03/13/13 2103 03/14/13 0615  WBC 7.4 8.1  HGB 12.7* 12.1*  HCT 39.8 37.1*  PLT 565* 528*   BMET  Recent Labs  03/13/13 2103 03/14/13 0615  NA 142 143  K 4.0 3.8  CL 102 107  CO2 30 28  GLUCOSE 103* 95  BUN 16 17  CREATININE 1.66* 1.60*  CALCIUM 8.8 8.5    Recent Labs  03/14/13 0615  TROPONINI <0.30    Lab Results  Component Value Date   CHOL 154 03/14/2013   HDL 31* 03/14/2013   LDLCALC 106* 03/14/2013   TRIG 87 03/14/2013   CHOLHDL 5.0 03/14/2013   No results found for this basename: HGBA1C     No results found for this basename: TSH    Hepatic Function Panel No results found for this basename: PROT, ALBUMIN, AST, ALT, ALKPHOS, BILITOT, BILIDIR, IBILI,  in the last 72 hours  Recent Labs   03/14/13 0615  CHOL 154   No results found for this basename: PROTIME,  in the last 72 hours      Studies/Results: Dg Chest Portable 1 View  03/13/2013   CLINICAL DATA:  Shortness of breath and chest pain  EXAM: PORTABLE CHEST - 1 VIEW  COMPARISON:  09/16/2012  FINDINGS: Cardiac shadow remains enlarged. A pacing device is again seen. The lungs are clear bilaterally.  IMPRESSION: No active disease.   Electronically Signed   By: Alcide Clever M.D.   On: 03/13/2013 21:05   Dg Shoulder Left  03/13/2013   *RADIOLOGY REPORT*  Clinical Data: Left shoulder pain, no trauma.  LEFT SHOULDER - 2+ VIEW  Comparison: None available at time of study interpretation.  Findings: The humeral head is well-formed and located.  The subacromial, glenohumeral and acromioclavicular joint spaces are intact.  No destructive bony lesions.  Soft tissue planes are non- suspicious.  Partially imaged remote left posterior rib fracture.  Cardiac pacemaker partially imaged.  IMPRESSION: No acute fracture deformity, dislocation or advanced degenerative change for age.   Original Report Authenticated By: Awilda Metro    Medications: I have reviewed the patient's current medications. Scheduled Meds: . aspirin EC  81 mg Oral Daily  . atorvastatin  40 mg Oral q1800  . gabapentin  100 mg Oral BID  . hydroxyurea  500 mg Oral QODAY  .  metoprolol tartrate  25 mg Oral BID  . nitroGLYCERIN  0.2 mg Transdermal Q24H  . pantoprazole  40 mg Oral Daily  . risperiDONE  1 mg Oral BID  . tamsulosin  0.4 mg Oral QPM  . zolpidem  5 mg Oral QHS   Continuous Infusions:  PRN Meds:.acetaminophen, nitroGLYCERIN, ondansetron (ZOFRAN) IV  Assessment/Plan: Principal Problem:   Chest pain Active Problems:   Atrial fibrillation   Long term (current) use of anticoagulants   CKD (chronic kidney disease)   Hypertrophic cardiomyopathy   Bipolar disorder, unspecified   Cardiac pacemaker in situ   Shortness of breath  PLAN: Pt not sure  what type of PPM he has, though last checked 9/23 though I cannot access the results.  Last echo 10/26/10 EF 60% Aortic valve Sclerosis, LV hypertrophy. severly dilated Lt. And Rt atria.  Negative Troponin, Pro BNP 2051.  INR 3.42 coumadin on hold for now.  Pt in a fib with pacing  On Nitroderm patch 0.2 mg home dose, but continued chest discomfort, though sounds MSK.  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  Interrogated- single chamber, pacing 98% of the time with underlying rhythm afib with rate of 50.    If no cath, resume coumadin vs. Doing nuc study.  Pt has no recollection of cath or stress test in the past.    Pt had NMR lipid panel  01/2013, LDL particle was increased at 1620, sm particle LDL 835  TG 165, HDL 30 his fenofibrate increased to 160 mg.  this has not been ordered for hospital.  Will resume.  No record of CAD.  He was on Lasix at home 40 in the am and 20 in the pm. Currently on none and he has not rec'd any IV.  Will resume po.     LOS: 1 day   Time spent with pt. :30 minutes. [facility] R  Nurse Practitioner Certified Pager [phone] 03/14/2013, 10:02 AM   I have seen and examined the patient along with [facility] R   NP.  I have reviewed the chart, notes and new data.  I agree with NP's note. Reviewed echo  Key new complaints: no further chest pain - sounds more like musculoskeletal in etiology Key examination changes: no clinical CHF Key new findings / data: 100% V paced; normal enzymes; echo shows normal LVEF and no overt wall motion abnormalities (although many views not optimal)  PLAN: Lexiscan Myoview Threshold for cath is high in view of advanced CKD (stage 3) Thurmon Fair, MD, [facility] [phone] 03/14/2013, 3:45 PM

## 2013-03-14 NOTE — Progress Notes (Signed)
  Echocardiogram 2D Echocardiogram has been performed.  [name] 03/14/2013, 12:16 PM

## 2013-03-14 NOTE — H&P (Signed)
[name] is an 79 y.o. male.   Chief Complaint: Chest pain and shortness of breath HPI: Mr Gingras presents with several weeks of worsening shortness of breath with exertion and chest pain. He notices this when he walks with his walker. Sometimes the pain radiates to the left shoulder. There is some associated tingling in the left shoulder. He has had some right shoulder pain with movement. He hasn't had the pain at rest until tonight. The pain is described as an ache or burn in the left breast area that radiates towards the middle of his sternum. The symptoms usually go away with rest. It sounds like chronologically he walks and gets short of breath and then has the ache in his chest which sometimes involves the left shoulder. He hasn't had any diaphoresis, nausea, vomiting, or syncope. He has a hsitory of atrial fibrillation and is currently on coumadin. He has a pacemaker. He isn't able to tell me too much about the pacemaker.   Past Medical History  Diagnosis Date  . Anemia   . Chronic constipation   . Bloating   . Colonic neoplasm   . Occult blood positive stool   . A-fib   . Hypertrophic cardiomyopathy   . Bipolar disorder   . Diastolic heart failure   . Pacemaker   . Depression   . Chronic kidney disease   . Thrombocytosis     Past Surgical History  Procedure Laterality Date  . Colonoscopy  10/02/2010    W/POLYP  . Fracture surgery    . Pacemaker insertion    . Cholecystectomy      History reviewed. No pertinent family history. Social History:  reports that he quit smoking about 40 years ago. He does not have any smokeless tobacco history on file. He reports that he does not drink alcohol or use illicit drugs.  Allergies:  Allergies  Allergen Reactions  . Abilify [Aripiprazole]   . Demerol   . Morphine And Related     Medications Prior to Admission  Medication Sig Dispense Refill  . fenofibrate 160 MG tablet Take 1 tablet (160 mg total) by mouth daily.  30  tablet  5  . furosemide (LASIX) 20 MG tablet Take 20-40 mg by mouth 2 (two) times daily. Take two tablets by mouth daily in the morning and one tablet in the evening      . gabapentin (NEURONTIN) 100 MG capsule Take 100 mg by mouth 2 (two) times daily.       . hydroxyurea (HYDREA) 500 MG capsule Take 500 mg by mouth every other day. May take with food to minimize GI side effects.      Marland Kitchen loratadine (CLARITIN) 10 MG tablet Take 10 mg by mouth daily.      . metoprolol tartrate (LOPRESSOR) 25 MG tablet Take 25 mg by mouth 2 (two) times daily.      . Multiple Vitamins-Minerals (CERTA-VITE SENIOR-LUTEIN PO) Take 1 tablet by mouth every morning.      . nitroGLYCERIN (NITRODUR - DOSED IN MG/24 HR) 0.2 mg/hr Place 1 patch onto the skin daily.      Marland Kitchen omeprazole (PRILOSEC) 20 MG capsule Take 20 mg by mouth daily.        . potassium chloride (KLOR-CON) 20 MEQ packet Take 20 mEq by mouth daily.       . risperiDONE (RISPERDAL) 1 MG tablet Take 1 mg by mouth 2 (two) times daily.      Marland Kitchen senna (SENOKOT) 8.6 MG TABS  Take 2 tablets by mouth.      . Tamsulosin HCl (FLOMAX) 0.4 MG CAPS Take 0.4 mg by mouth every evening.       . temazepam (RESTORIL) 7.5 MG capsule Take 1 capsule (7.5 mg total) by mouth every evening. 6pm  30 capsule  1  . vitamin B-12 (CYANOCOBALAMIN) 1000 MCG tablet Take 1,000 mcg by mouth daily.        Marland Kitchen warfarin (COUMADIN) 2 MG tablet Take 2mg  daily except on Wednesdays and Saturdays take 4mg .  **Corrected Dosage**  40 tablet  0    Results for orders placed during the hospital encounter of 03/13/13 (from the past 48 hour(s))  PRO B NATRIURETIC PEPTIDE     Status: Abnormal   Collection Time    03/13/13  8:56 PM      Result Value Range   Pro B Natriuretic peptide (BNP) 2051.0 (*) 0 - 450 pg/mL  CBC     Status: Abnormal   Collection Time    03/13/13  9:03 PM      Result Value Range   WBC 7.4  4.0 - 10.5 K/uL   RBC 4.06 (*) 4.22 - 5.81 MIL/uL   Hemoglobin 12.7 (*) 13.0 - 17.0 g/dL   HCT  16.1  09.6 - 04.5 %   MCV 98.0  78.0 - 100.0 fL   MCH 31.3  26.0 - 34.0 pg   MCHC 31.9  30.0 - 36.0 g/dL   RDW 40.9  81.1 - 91.4 %   Platelets 565 (*) 150 - 400 K/uL  BASIC METABOLIC PANEL     Status: Abnormal   Collection Time    03/13/13  9:03 PM      Result Value Range   Sodium 142  135 - 145 mEq/L   Potassium 4.0  3.5 - 5.1 mEq/L   Chloride 102  96 - 112 mEq/L   CO2 30  19 - 32 mEq/L   Glucose, Bld 103 (*) 70 - 99 mg/dL   BUN 16  6 - 23 mg/dL   Creatinine, Ser 7.82 (*) 0.50 - 1.35 mg/dL   Calcium 8.8  8.4 - 95.6 mg/dL   GFR calc non Af Amer 36 (*) >90 mL/min   GFR calc Af Amer 41 (*) >90 mL/min   Comment: (NOTE)     The eGFR has been calculated using the CKD EPI equation.     This calculation has not been validated in all clinical situations.     eGFR's persistently <90 mL/min signify possible Chronic Kidney     Disease.  POCT I-STAT TROPONIN I     Status: None   Collection Time    03/13/13  9:06 PM      Result Value Range   Troponin i, poc 0.04  0.00 - 0.08 ng/mL   Comment 3            Comment: Due to the release kinetics of cTnI,     a negative result within the first hours     of the onset of symptoms does not rule out     myocardial infarction with certainty.     If myocardial infarction is still suspected,     repeat the test at appropriate intervals.  PROTIME-INR     Status: Abnormal   Collection Time    03/13/13  9:17 PM      Result Value Range   Prothrombin Time 35.8 (*) 11.6 - 15.2 seconds   INR 3.77 (*) 0.00 -  1.49   Dg Chest Portable 1 View  03/13/2013   CLINICAL DATA:  Shortness of breath and chest pain  EXAM: PORTABLE CHEST - 1 VIEW  COMPARISON:  09/16/2012  FINDINGS: Cardiac shadow remains enlarged. A pacing device is again seen. The lungs are clear bilaterally.  IMPRESSION: No active disease.   Electronically Signed   By: Alcide Clever M.D.   On: 03/13/2013 21:05   Dg Shoulder Left  03/13/2013   *RADIOLOGY REPORT*  Clinical Data: Left shoulder pain,  no trauma.  LEFT SHOULDER - 2+ VIEW  Comparison: None available at time of study interpretation.  Findings: The humeral head is well-formed and located.  The subacromial, glenohumeral and acromioclavicular joint spaces are intact.  No destructive bony lesions.  Soft tissue planes are non- suspicious.  Partially imaged remote left posterior rib fracture.  Cardiac pacemaker partially imaged.  IMPRESSION: No acute fracture deformity, dislocation or advanced degenerative change for age.   Original Report Authenticated By: Awilda Metro    Review of Systems  Constitutional: Negative.   HENT: Negative.   Eyes: Negative.   Respiratory: Positive for shortness of breath.   Cardiovascular: Positive for chest pain.  Gastrointestinal: Negative.   Genitourinary: Negative.   Musculoskeletal: Negative.   Skin: Negative.   Neurological: Negative.   Endo/Heme/Allergies: Negative.   Psychiatric/Behavioral: Negative.     Blood pressure 132/73, pulse 75, temperature 97.4 F (36.3 C), temperature source Oral, resp. rate 18, height 6\' 3"  (1.905 m), weight 218 lb 0.6 oz (98.9 kg), SpO2 99.00%. Physical Exam  Constitutional: He is oriented to person, place, and time. No distress.  Neck: No JVD present.  Cardiovascular: An irregular rhythm present. Frequent extrasystoles are present.  Murmur heard.  Systolic murmur is present with a grade of 2/6  Pulses:      Dorsalis pedis pulses are 2+ on the right side, and 2+ on the left side.       Posterior tibial pulses are 2+ on the right side, and 2+ on the left side.  At apex  Respiratory: He has no wheezes. He has rales.  GI: Soft. Bowel sounds are normal.  Musculoskeletal: He exhibits no edema.  Neurological: He is alert and oriented to person, place, and time.  Skin: Skin is warm and dry. He is not diaphoretic.    ECG- paced rhythm  Assessment/Plan Mr Raper presents with atypical angina. He mainly gets these symptoms with what sounds to be minimal  exertion. It sounds like he gets short winded and then has pain. Some of his pain symptoms sound completely musculoskeletal. His first troponin was detectable. He also has a history of hypetrophic cardiomyopathy in the chart, but I can't find any more documentation of this.   Acute coronary syndrome- He received full dose aspirin already. I will not start heparin given his systemic anticoagulation with coumadin. Will follow cardiac biomarkers. I will continue his metoprolol and start a statin. Unless his symptoms become worse, I think the best course would be medical management for Mr Printup.   Atrial fibrillation- Appears to be chronic and rate controlled. He's on coumadin. I'll hold the coumadin in the off chance that he needs catheterization.   HCM- listed as problem in the chart. He has no recent echo, and with his murmur its reasonable to get an echo.   DNR/DNI  Cardiac Diet  PT/OT    Robyne Peers 03/14/2013, 12:48 AM

## 2013-03-14 NOTE — Clinical Documentation Improvement (Signed)
THIS DOCUMENT IS NOT A PERMANENT PART OF THE MEDICAL RECORD  Please update your documentation with the medical record to reflect your response to this query. If you need help knowing how to do this please call [phone].  03/14/13   Dear Vern[facility],  In a better effort to capture your patient's severity of illness, reflect appropriate length of stay and utilization of resources, a review of the patient medical record has revealed the following indicators.    Based on your clinical judgment, please clarify and document in a progress note and/or discharge summary the clinical condition associated with the following supporting information:  In responding to this query please exercise your independent judgment.  The fact that a query is asked, does not imply that any particular answer is desired or expected.  Possible Clinical Conditions?  "     CKD Stage III - GFR 30-59  "     CKD Stage IV - GFR 15-29  "     Other condition  "     Cannot Clinically determine      Risk Factors:  CKD noted per 10/06 progress notes.   Lab:  Bun: 10/05:   16 10/06:   17  Creat: 10/05:   1.66 10/06:   1.60  GFR:  10/05:  36 10/06:  37    You may use possible, probable, or suspect with inpatient documentation. possible, probable, suspected diagnoses MUST be documented at the time of discharge  Reviewed:reviewed see below Thank You,  Marciano Sequin,  Clinical Documentation Specialist: [phone] Health Information Management [place]  I did review and added to note CKD stage 3.  I did this prior to discharge.

## 2013-03-15 ENCOUNTER — Inpatient Hospital Stay (HOSPITAL_COMMUNITY): Payer: Medicare Other

## 2013-03-15 DIAGNOSIS — R079 Chest pain, unspecified: Secondary | ICD-10-CM | POA: Diagnosis present

## 2013-03-15 MED ORDER — ATORVASTATIN CALCIUM 40 MG PO TABS: 40.0000 mg | ORAL_TABLET | Freq: Every day | ORAL | Status: DC

## 2013-03-15 MED ORDER — TECHNETIUM TC 99M SESTAMIBI GENERIC - CARDIOLITE
30.0000 | Freq: Once | INTRAVENOUS | Status: AC | PRN
  Administered 2013-03-15: 30 via INTRAVENOUS

## 2013-03-15 MED ORDER — TECHNETIUM TC 99M SESTAMIBI GENERIC - CARDIOLITE
10.0000 | Freq: Once | INTRAVENOUS | Status: AC | PRN
  Administered 2013-03-15: 10 via INTRAVENOUS

## 2013-03-15 MED ORDER — ASPIRIN 81 MG PO TBEC: 81.0000 mg | DELAYED_RELEASE_TABLET | Freq: Every day | ORAL | Status: DC

## 2013-03-15 MED ORDER — REGADENOSON 0.4 MG/5ML IV SOLN
INTRAVENOUS | Status: AC
  Administered 2013-03-15: 10:00:00 via INTRAVENOUS
  Filled 2013-03-15: qty 5

## 2013-03-15 MED ORDER — REGADENOSON 0.4 MG/5ML IV SOLN
0.4000 mg | Freq: Once | INTRAVENOUS | Status: AC
  Administered 2013-03-15: 0.4 mg via INTRAVENOUS

## 2013-03-15 NOTE — Discharge Summary (Signed)
Physician Discharge Summary  Patient ID: [name] MRN: [id number] DOB/AGE: 12-07-1924 79 y.o.  Admit date: 03/13/2013 Discharge date: 03/15/2013  Admission Diagnoses: Chest Pain with low risk for cardiac etiology  Discharge Diagnoses:  Principal Problem:   Chest pain with low risk for cardiac etiology- low risk stress test; likely musculoskeltal  Active Problems:   Atrial fibrillation   Long term (current) use of anticoagulants   CKD (chronic kidney disease)   Hypertrophic cardiomyopathy   Bipolar disorder, unspecified   Cardiac pacemaker in situ   Shortness of breath   Discharged Condition: stable  Hospital Course: The patient is a 79 y/o male with a history of atrial fibrillation, on chronic anticoagulation with Warfarin. He also has a PPM. He [facility] on 03/13/13 with a complaint of chest pain. His chest pain symptoms had both mixed typical and atypical features. His pain was dull/achy pain that radiated to the left shoulder, but was also worse with movement and also pleuritic. His EKG and CXR were both unremarkable. However, he was admitted for observation and further work-up. Cardiac enzymes were cycled and were negative x 3. His pain ultimately resolved. He underwent a nuclear stress test and was found to have no significant ischemia. His EF on the NST was 46%, however was normal on 2D echo. It was felt that his pain was non cardiac and likely musculoskeletal. He was last seen and examined by Dr. Rennis Golden, who determined that he was stable for discharge. He will follow-up with Wilburt Finlay, PA-C on 03/23/13.    Consults: None  Significant Diagnostic Studies:   NST 03/15/13  IMPRESSION: 1. Mild attenuation involving the anterior wall and left ventricular apex. No scintigraphic evidence of prior infarction or pharmacologically induced ischemia.  2. Mild hypokinesia involving the basilar aspect of the left ventricular septum. Ejection fraction -  46%.   2D Echo 03/14/13 Study Conclusions  - Procedure narrative: Transthoracic echocardiography. Image quality was adequate. The study was technically difficult. - Left ventricle: There was moderate concentric hypertrophy. Systolic function was normal. The estimated ejection fraction was in the range of 55% to 60%. Although no diagnostic regional wall motion abnormality was identified, this possibility cannot be completely excluded on the basis of this study. - Ventricular septum: Septal motion showed paradox. These changes are consistent with right ventricular pacing. - Mitral valve: Calcified annulus. - Left atrium: The atrium was massively dilated. - Right atrium: The atrium was severely dilated. - Tricuspid valve: Moderate regurgitation. - Pulmonary arteries: Systolic pressure was mildly increased. PA peak pressure: 43mm Hg (S).   Treatments:  See Hospital Course  Discharge Exam: Blood pressure 129/56, pulse 74, temperature 98.7 F (37.1 C), temperature source Oral, resp. rate 20, height 6\' 3"  (1.905 m), weight 218 lb 0.6 oz (98.9 kg), SpO2 100.00%.   Disposition: 01-Home or Self Care      Discharge Orders   Future Appointments Provider Department Dept Phone   03/21/2013 3:00 PM [facility] Pharmacist [facility] [phone]   03/23/2013 2:20 PM [address] [facility] [phone]   04/08/2013 12:20 PM Ileana Ladd, MD [facility] [phone]   Future Orders Complete By Expires   Diet - low sodium heart healthy  As directed    Increase activity slowly  As directed        Medication List         aspirin 81 MG EC tablet  Take 1 tablet (81 mg total) by  mouth daily.     atorvastatin 40 MG tablet  Commonly known as:  LIPITOR  Take 1 tablet (40 mg total) by mouth daily at 6 PM.     CERTA-VITE SENIOR-LUTEIN PO  Take 1 tablet by mouth every morning.     fenofibrate 160 MG tablet  Take 1 tablet  (160 mg total) by mouth daily.     furosemide 20 MG tablet  Commonly known as:  LASIX  Take 20-40 mg by mouth 2 (two) times daily. Take two tablets by mouth daily in the morning and one tablet in the evening     gabapentin 100 MG capsule  Commonly known as:  NEURONTIN  Take 100 mg by mouth 2 (two) times daily.     hydroxyurea 500 MG capsule  Commonly known as:  HYDREA  Take 500 mg by mouth every other day. May take with food to minimize GI side effects.     loratadine 10 MG tablet  Commonly known as:  CLARITIN  Take 10 mg by mouth daily.     metoprolol tartrate 25 MG tablet  Commonly known as:  LOPRESSOR  Take 25 mg by mouth 2 (two) times daily.     nitroGLYCERIN 0.2 mg/hr patch  Commonly known as:  NITRODUR - Dosed in mg/24 hr  Place 1 patch onto the skin daily.     omeprazole 20 MG capsule  Commonly known as:  PRILOSEC  Take 20 mg by mouth daily.     potassium chloride 20 MEQ packet  Commonly known as:  KLOR-CON  Take 20 mEq by mouth daily.     risperiDONE 1 MG tablet  Commonly known as:  RISPERDAL  Take 1 mg by mouth 2 (two) times daily.     senna 8.6 MG Tabs tablet  Commonly known as:  SENOKOT  Take 2 tablets by mouth.     tamsulosin 0.4 MG Caps capsule  Commonly known as:  FLOMAX  Take 0.4 mg by mouth every evening.     temazepam 7.5 MG capsule  Commonly known as:  RESTORIL  Take 1 capsule (7.5 mg total) by mouth every evening. 6pm     vitamin B-12 1000 MCG tablet  Commonly known as:  CYANOCOBALAMIN  Take 1,000 mcg by mouth daily.     warfarin 2 MG tablet  Commonly known as:  COUMADIN  - Take 2mg  daily except on Wednesdays and Saturdays take 4mg .  -   - **Corrected Dosage**       Follow-up Information   Follow up with Wilburt Finlay, PA-C On 03/23/2013. (2:20 pm)    Specialty:  Physician Assistant   Contact information:   [address] [place] [address] [phone]      TIME SPENT ON DISCHARGE, INCLUDING PHYSICIAN TIME:  >30 MINUTES   Signed: Allayne Butcher, PA-C 03/15/2013, 2:32 PM

## 2013-03-15 NOTE — Progress Notes (Addendum)
Spoke with pt's Dtr who said pt has allergy to abilify, but not risperdal.  Spoke with PA and informed to give medication.

## 2013-03-15 NOTE — Progress Notes (Signed)
Subjective: Reports 2/10 CP .  Worse with inspriation  Objective: Vital signs in last 24 hours: Temp:  [97.7 F (36.5 C)-98.5 F (36.9 C)] 98.5 F (36.9 C) (10/07 0423) Pulse Rate:  [75-77] 75 (10/07 0932) Resp:  [18-21] 21 (10/06 2144) BP: (122-138)/(52-64) 126/64 mmHg (10/07 0932) SpO2:  [96 %-99 %] 96 % (10/07 0423) Last BM Date: 03/13/13  Intake/Output from previous day: 10/06 0701 - 10/07 0700 In: 360 [P.O.:360] Out: 750 [Urine:750] Intake/Output this shift:    Medications Current Facility-Administered Medications  Medication Dose Route Frequency Provider Last Rate Last Dose  . acetaminophen (TYLENOL) tablet 650 mg  650 mg Oral Q4H PRN Anne Ng, MD      . aspirin EC tablet 81 mg  81 mg Oral Daily Anne Ng, MD   81 mg at 03/14/13 1044  . atorvastatin (LIPITOR) tablet 40 mg  40 mg Oral q1800 Anne Ng, MD   40 mg at 03/14/13 1723  . fenofibrate tablet 160 mg  160 mg Oral Daily Nada Boozer, NP   160 mg at 03/14/13 1239  . furosemide (LASIX) tablet 40 mg  40 mg Oral BID Nada Boozer, NP   40 mg at 03/15/13 0756  . gabapentin (NEURONTIN) capsule 100 mg  100 mg Oral BID Anne Ng, MD   100 mg at 03/15/13 0756  . guaiFENesin (MUCINEX) 12 hr tablet 600 mg  600 mg Oral BID Brittainy Simmons, PA-C   600 mg at 03/14/13 2236  . hydroxyurea (HYDREA) capsule 500 mg  500 mg Oral QODAY Anne Ng, MD   500 mg at 03/14/13 1044  . metoprolol tartrate (LOPRESSOR) tablet 25 mg  25 mg Oral BID Anne Ng, MD   25 mg at 03/14/13 2236  . nitroGLYCERIN (NITRODUR - Dosed in mg/24 hr) patch 0.2 mg  0.2 mg Transdermal Q24H Anne Ng, MD   0.2 mg at 03/15/13 0756  . nitroGLYCERIN (NITROSTAT) SL tablet 0.4 mg  0.4 mg Sublingual Q5 Min x 3 PRN Anne Ng, MD      . ondansetron [facility]) injection 4 mg  4 mg Intravenous Q6H PRN Anne Ng, MD      . pantoprazole (PROTONIX) EC tablet 40 mg  40 mg Oral Daily Anne Ng, MD   40 mg at 03/14/13 1044  . potassium chloride SA (K-DUR,KLOR-CON) CR  tablet 20 mEq  20 mEq Oral Daily Lennette Bihari, MD   20 mEq at 03/14/13 1239  . regadenoson (LEXISCAN) 0.4 MG/5ML injection SOLN           . risperiDONE (RISPERDAL) tablet 1 mg  1 mg Oral BID Anne Ng, MD   1 mg at 03/14/13 1044  . senna (SENOKOT) tablet 17.2 mg  2 tablet Oral Daily Nada Boozer, NP   17.2 mg at 03/14/13 1239  . tamsulosin (FLOMAX) capsule 0.4 mg  0.4 mg Oral QPM Anne Ng, MD   0.4 mg at 03/14/13 1723  . zolpidem (AMBIEN) tablet 5 mg  5 mg Oral QHS Quintella Reichert, MD   5 mg at 03/14/13 2236    PE: General appearance: alert, cooperative and no distress Lungs: clear to auscultation bilaterally Heart: regular rate and rhythm, S1, S2 normal, no murmur, click, rub or gallop Extremities: No LEE Pulses: 2+ and symmetric Neurologic: Grossly normal  Lab Results:   Recent Labs  03/13/13 2103 03/14/13 0615  WBC 7.4 8.1  HGB 12.7* 12.1*  HCT 39.8 37.1*  PLT 565* 528*   BMET  Recent  Labs  03/13/13 2103 03/14/13 0615  NA 142 143  K 4.0 3.8  CL 102 107  CO2 30 28  GLUCOSE 103* 95  BUN 16 17  CREATININE 1.66* 1.60*  CALCIUM 8.8 8.5   PT/INR  Recent Labs  03/13/13 2117 03/14/13 0615  LABPROT 35.8* 33.2*  INR 3.77* 3.42*   Cholesterol  Recent Labs  03/14/13 0615  CHOL 154   Lipid Panel     Component Value Date/Time   CHOL 154 03/14/2013 0615   TRIG 87 03/14/2013 0615   HDL 31* 03/14/2013 0615   CHOLHDL 5.0 03/14/2013 0615   VLDL 17 03/14/2013 0615   LDLCALC 106* 03/14/2013 0615    Cardiac Panel (last 3 results)  Recent Labs  03/14/13 0615 03/14/13 0945  TROPONINI <0.30 <0.30   Assessment/Plan   Principal Problem:   Chest pain Active Problems:   Atrial fibrillation   Long term (current) use of anticoagulants   CKD (chronic kidney disease)   Hypertrophic cardiomyopathy   Bipolar disorder, unspecified   Cardiac pacemaker in situ   Shortness of breath  Plan:  Myoview NST today.  Ruled out for MI.  INR supratherapeutic.  Coumadin on  hold.  BP and HR stable.   LOS: 2 days    [name] 03/15/2013 10:03 AM

## 2013-03-15 NOTE — Progress Notes (Addendum)
Pt. Seen and examined. Agree with the NP/PA-C note as written.  No significant ischemia. EF 46% on NST, but normal on echo, probably accounted for by paced incoordinate septal motion.  I agree pain is more likely musculoskeletal/pleuritic. Resume home warfarin. He does have a lot of mucous - wants to continue on BID mucinex after discharge. Ok for discharge home today. Follow-up with MLP or Dr. C in 7-10 days in the office.  Chrystie Nose, MD, [facility] Attending Cardiologist [facility]

## 2013-03-15 NOTE — Evaluation (Signed)
Physical Therapy Evaluation Patient Details Name: [name] MRN: [id number] DOB: 10-17-1924 Today's Date: 03/15/2013 Time: 4098-1191 PT Time Calculation (min): 20 min  PT Assessment / Plan / Recommendation History of Present Illness  Pt is a 79 y/o male presenting with several weeks of worsening shortness of breath with exertion and chest pain. He notices this when he walks with his walker. Sometimes the pain radiates to the left shoulder. There is some associated tingling in the left shoulder. He has had some right shoulder pain with movement. He hasn't had the pain at rest until tonight. The pain is described as an ache or burn in the left breast area that radiates towards the middle of his sternum. The symptoms usually go away with rest. It sounds like chronologically he walks and gets short of breath and then has the ache in his chest which sometimes involves the left shoulder. He hasn't had any diaphoresis, nausea, vomiting, or syncope. He has a hsitory of atrial fibrillation and is currently on coumadin. He has a pacemaker. He isn't able to tell me too much about the pacemaker.   Clinical Impression  Pt admitted with chest pain. Pt currently with functional limitations due to the deficits listed below (see PT Problem List). Pt will benefit from skilled PT in order to increase their independence and safety with mobility to allow discharge. Pt required min A during bed mobility and transfers today, he was able to ambulate 100' with RW and min guard for safety. PT recommends ST-SNF to improve LE strength, endurance and safety with mobility (pt reports history of falls, due to R knee pain).  If assisted living facility can provide min assist for mobility pt can d/c back to assisted living as pt prefers to d/c to ALF, if not, pt would benefit from ST-SNF.    PT Assessment  Patient needs continued PT services    Follow Up Recommendations  SNF    Does the patient have the potential to tolerate  intense rehabilitation      Barriers to Discharge        Equipment Recommendations  None recommended by PT    Recommendations for Other Services     Frequency Min 3X/week    Precautions / Restrictions Precautions Precautions: Fall Restrictions Weight Bearing Restrictions: No   Pertinent Vitals/Pain Pt reports no pain at rest but reports R knee pain during ambulation (9/10), pt repositioned to comfort at end of session. SaO2 on room air after amb: 96% with no c/o SOB.      Mobility  Bed Mobility Bed Mobility: Rolling Left;Left Sidelying to Sit;Sit to Supine;Sitting - Scoot to Edge of Bed Rolling Left: 5: Supervision Left Sidelying to Sit: 4: Min assist Sitting - Scoot to Edge of Bed: 4: Min guard Sit to Supine: 5: Set up Details for Bed Mobility Assistance: Min assist during bed mobility to assist LE on/off bed. PT elevated bed during stand to sit due to pt's decreased eccentric control when transitioning from stand to sit. Transfers Transfers: Sit to Stand;Stand to Sit Sit to Stand: From chair/3-in-1;From bed;4: Min assist;With upper extremity assist;With armrests Stand to Sit: 4: Min guard;To chair/3-in-1;To bed;With armrests;With upper extremity assist Details for Transfer Assistance: MIn assist during sit to stand (more assist when rising from bed) due to R knee pain and LE weakness. VC's for hand placement. Ambulation/Gait Ambulation/Gait Assistance: 4: Min guard Ambulation Distance (Feet): 100 Feet Assistive device: Rolling walker Ambulation/Gait Assistance Details: Min guard to ensure safety due to pt  reporting R knee gives out. VC's to stay within RW. SaO2 on room air after amb: 96% with no c/o SOB. Gait Pattern: Step-through pattern;Decreased stride length;Decreased dorsiflexion - right;Decreased dorsiflexion - left;Trunk flexed;Decreased stance time - right Gait velocity: Decreased General Gait Details: Decreased DF (L>R) Stairs: No Wheelchair Mobility Wheelchair  Mobility: No    Exercises     PT Diagnosis: Difficulty walking;Generalized weakness  PT Problem List: Decreased strength;Decreased activity tolerance;Decreased mobility;Decreased knowledge of use of DME;Decreased safety awareness;Pain PT Treatment Interventions: DME instruction;Gait training;Functional mobility training;Therapeutic activities;Therapeutic exercise;Patient/family education     PT Goals(Current goals can be found in the care plan section) Acute Rehab PT Goals Patient Stated Goal: to go back to assisted living PT Goal Formulation: With patient Time For Goal Achievement: 03/29/13 Potential to Achieve Goals: Good  Visit Information  Last PT Received On: 03/15/13 Assistance Needed: +1 History of Present Illness: Pt is a 79 y/o male presenting with several weeks of worsening shortness of breath with exertion and chest pain. He notices this when he walks with his walker. Sometimes the pain radiates to the left shoulder. There is some associated tingling in the left shoulder. He has had some right shoulder pain with movement. He hasn't had the pain at rest until tonight. The pain is described as an ache or burn in the left breast area that radiates towards the middle of his sternum. The symptoms usually go away with rest. It sounds like chronologically he walks and gets short of breath and then has the ache in his chest which sometimes involves the left shoulder. He hasn't had any diaphoresis, nausea, vomiting, or syncope. He has a hsitory of atrial fibrillation and is currently on coumadin. He has a pacemaker. He isn't able to tell me too much about the pacemaker.        Prior Functioning  Home Living Family/patient expects to be discharged to:: Assisted living Home Equipment: Dan Humphreys - 2 wheels;Electric scooter Prior Function Level of Independence: Needs assistance Comments: pt reports assisted living facility provides meals for pt, he uses RW or power scooter to get to dining  hall, he also requires assistance with dressing and bathing. pt and pt's sister reports he has fallen several times in the last 6 months when trying to get to toilet. Communication Communication: No difficulties    Cognition  Cognition Arousal/Alertness: Awake/alert Behavior During Therapy: WFL for tasks assessed/performed Overall Cognitive Status: Within Functional Limits for tasks assessed    Extremity/Trunk Assessment Lower Extremity Assessment Lower Extremity Assessment: Generalized weakness;RLE deficits/detail RLE Deficits / Details: Pt reports R knee pain during ambulation at all times (9/10) and reports "my R knee just gives out on me sometimes".   Balance Balance Balance Assessed: Yes Dynamic Standing Balance Dynamic Standing - Balance Support: No upper extremity supported;During functional activity Dynamic Standing - Level of Assistance: 5: Stand by assistance Dynamic Standing - Comments: Pt able to maintain balance while urinating at toilet with min guard to assist with gown and for safety, pt able to reach outside BOS to flush toilet with LOB. Pt did not make it the toilet prior to urinating and was unaware that urine was on the floor until PT cautioned pt that the floor was being cleaned so he could safely ambulate.  End of Session PT - End of Session Equipment Utilized During Treatment: Gait belt Activity Tolerance: Patient limited by pain;Patient limited by fatigue Patient left: in bed;with call bell/phone within reach;with family/visitor present Nurse Communication: Mobility status  GP  Sol Blazing 03/15/2013, 3:16 PM

## 2013-03-15 NOTE — Evaluation (Signed)
I have reviewed this note and agree with all findings. Kati Hanin [place], PT, DPT Pager: [phone]   

## 2013-03-15 NOTE — Progress Notes (Signed)
CSW spoke with ALF who stated that before they accept patient back, they need to come assess patient's mobility and make sure they can assist his needs. CSW shared this information to patient and family at bedside. Family stated they were very angry and that this place is his home. CSW gave family ALF contact information to call and express their concerns.   Maree Krabbe, MSW, [facility] [phone]

## 2013-03-15 NOTE — Progress Notes (Addendum)
CSW faxed over clinicals to [name] to receive a PASRR number for possible SNF placement. CSW awaiting PT evaluation for their recommendation.  Maree Krabbe, MSW, [facility] [phone]

## 2013-03-15 NOTE — Progress Notes (Signed)
Questioned given pt scheduled risperdal since it is listed as new allergy as of 03/14/13. Unsure of reaction to medication, and pt is poor historian. Spoke with MD Hilty and PA Wilburt Finlay. Awaiting decision as to whether to give medication or not. Will hold off until further orders.

## 2013-03-15 NOTE — Progress Notes (Signed)
Called report to [facility] facility. Facility is ready to receive pt, and he is stable for transport with daughter. Daughter has packet to give facility upon arrival.

## 2013-03-15 NOTE — Progress Notes (Signed)
CSW spoke to daughter who stated that she wants patient to leave today and has called the ALF who stated we can go ahead and send patient back.. Patient and family agreeable to this plan and arranging transport via family . CSW will sign off, as social work intervention is no longer needed.  Maree Krabbe, MSW, [facility] [phone]

## 2013-03-16 ENCOUNTER — Telehealth: Payer: Self-pay | Admitting: Pharmacist

## 2013-03-16 ENCOUNTER — Encounter (HOSPITAL_COMMUNITY): Payer: Self-pay | Admitting: Cardiology

## 2013-03-16 DIAGNOSIS — N183 Chronic kidney disease, stage 3 unspecified: Secondary | ICD-10-CM

## 2013-03-16 HISTORY — DX: Chronic kidney disease, stage 3 unspecified: N18.30

## 2013-03-16 NOTE — Progress Notes (Signed)
Please note pt has CKD stage 3.

## 2013-03-17 NOTE — Telephone Encounter (Signed)
Influenza vaccine was given 02/28/13.  Faxed copy to [facility] at [phone] Sherrell Puller.

## 2013-03-21 ENCOUNTER — Encounter: Payer: Self-pay | Admitting: Family Medicine

## 2013-03-21 ENCOUNTER — Ambulatory Visit (INDEPENDENT_AMBULATORY_CARE_PROVIDER_SITE_OTHER): Payer: Medicare Other | Admitting: Family Medicine

## 2013-03-21 VITALS — BP 104/55 | HR 75 | Temp 97.8°F | Wt 220.4 lb

## 2013-03-21 DIAGNOSIS — F319 Bipolar disorder, unspecified: Secondary | ICD-10-CM

## 2013-03-21 DIAGNOSIS — N183 Chronic kidney disease, stage 3 unspecified: Secondary | ICD-10-CM

## 2013-03-21 DIAGNOSIS — F3289 Other specified depressive episodes: Secondary | ICD-10-CM

## 2013-03-21 DIAGNOSIS — Z7901 Long term (current) use of anticoagulants: Secondary | ICD-10-CM

## 2013-03-21 DIAGNOSIS — M25512 Pain in left shoulder: Secondary | ICD-10-CM | POA: Insufficient documentation

## 2013-03-21 DIAGNOSIS — Z950 Presence of cardiac pacemaker: Secondary | ICD-10-CM

## 2013-03-21 DIAGNOSIS — F329 Major depressive disorder, single episode, unspecified: Secondary | ICD-10-CM

## 2013-03-21 DIAGNOSIS — I422 Other hypertrophic cardiomyopathy: Secondary | ICD-10-CM

## 2013-03-21 DIAGNOSIS — I5030 Unspecified diastolic (congestive) heart failure: Secondary | ICD-10-CM

## 2013-03-21 DIAGNOSIS — D473 Essential (hemorrhagic) thrombocythemia: Secondary | ICD-10-CM

## 2013-03-21 DIAGNOSIS — I4891 Unspecified atrial fibrillation: Secondary | ICD-10-CM

## 2013-03-21 DIAGNOSIS — N189 Chronic kidney disease, unspecified: Secondary | ICD-10-CM

## 2013-03-21 DIAGNOSIS — M25519 Pain in unspecified shoulder: Secondary | ICD-10-CM

## 2013-03-21 LAB — POCT INR: INR: 3.1

## 2013-03-21 NOTE — Progress Notes (Signed)
Patient ID: [name], male   DOB: Dec 27, 1924, 79 y.o.   MRN: [id number] SUBJECTIVE: CC: Chief Complaint  Patient presents with  . Hospitalization Follow-up    wwent to see about shloulder pain     HPI: Recently seen at the hospital for chest pain and left shoulder pain. Work up for cardiac cause , unrevealing, patient discharged home., he has a history of a fib. INR was a little high. Left shoulder continues to hiurt. Needs PT as recommended from his hospitalization.. No angina. occassional chest and nasal congestion daughters wanted him to have an order ta the nursing facility for mucinex.  Past Medical History  Diagnosis Date  . Anemia   . Chronic constipation   . Bloating   . Colonic neoplasm   . Occult blood positive stool   . A-fib   . Hypertrophic cardiomyopathy   . Bipolar disorder   . Diastolic heart failure   . Pacemaker     placed and followed at [facility]  . Depression   . Chronic kidney disease   . Thrombocytosis   . CKD (chronic kidney disease) stage 3, GFR 30-59 ml/min 03/16/2013   Past Surgical History  Procedure Laterality Date  . Colonoscopy  10/02/2010    W/[facility]  . Fracture surgery    . Pacemaker insertion      [facility]  . Cholecystectomy     History   Social History  . Marital Status: Widowed    Spouse Name: N/A    Number of Children: N/A  . Years of Education: N/A   Occupational History  . Not on file.   Social History Main Topics  . Smoking status: Former Smoker    Quit date: 10/05/1972  . Smokeless tobacco: Not on file  . Alcohol Use: No  . Drug Use: No  . Sexual Activity: Not on file   Other Topics Concern  . Not on file   Social History Narrative  . No narrative on file   Family History  Problem Relation Age of Onset  . Heart disease Mother   . Heart disease Sister   . Heart disease Brother    Current Outpatient Prescriptions on File Prior to Visit  Medication Sig Dispense Refill  . aspirin EC 81 MG EC tablet Take 1  tablet (81 mg total) by mouth daily.      Marland Kitchen atorvastatin (LIPITOR) 40 MG tablet Take 1 tablet (40 mg total) by mouth daily at 6 PM.  30 tablet  5  . fenofibrate 160 MG tablet Take 1 tablet (160 mg total) by mouth daily.  30 tablet  5  . furosemide (LASIX) 20 MG tablet Take 20-40 mg by mouth 2 (two) times daily. Take two tablets by mouth daily in the morning and one tablet in the evening      . gabapentin (NEURONTIN) 100 MG capsule Take 100 mg by mouth 2 (two) times daily.       . hydroxyurea (HYDREA) 500 MG capsule Take 500 mg by mouth every other day. May take with food to minimize GI side effects.      Marland Kitchen loratadine (CLARITIN) 10 MG tablet Take 10 mg by mouth daily.      . metoprolol tartrate (LOPRESSOR) 25 MG tablet Take 25 mg by mouth 2 (two) times daily.      . Multiple Vitamins-Minerals (CERTA-VITE SENIOR-LUTEIN PO) Take 1 tablet by mouth every morning.      . nitroGLYCERIN (NITRODUR - DOSED IN MG/24 HR) 0.2 mg/hr Place  1 patch onto the skin daily.      Marland Kitchen omeprazole (PRILOSEC) 20 MG capsule Take 20 mg by mouth daily.        . potassium chloride (KLOR-CON) 20 MEQ packet Take 20 mEq by mouth daily.       . risperiDONE (RISPERDAL) 1 MG tablet Take 1 mg by mouth 2 (two) times daily.      Marland Kitchen senna (SENOKOT) 8.6 MG TABS Take 2 tablets by mouth.      . Tamsulosin HCl (FLOMAX) 0.4 MG CAPS Take 0.4 mg by mouth every evening.       . temazepam (RESTORIL) 7.5 MG capsule Take 1 capsule (7.5 mg total) by mouth every evening. 6pm  30 capsule  1  . vitamin B-12 (CYANOCOBALAMIN) 1000 MCG tablet Take 1,000 mcg by mouth daily.        Marland Kitchen warfarin (COUMADIN) 2 MG tablet Take 2mg  daily except on Wednesdays and Saturdays take 4mg .  **Corrected Dosage**  40 tablet  0   No current facility-administered medications on file prior to visit.   Allergies  Allergen Reactions  . Abilify [Aripiprazole]     Other reaction(s): Other (See Comments) Hallucinations  . Demerol   . Meperidine     Other reaction(s): Mental  Status Changes (intolerance)  . Morphine Other (See Comments)    Hallucinations  . Morphine And Related   . Risperdal [Risperidone] Other (See Comments)    Weakness, tiredness   Immunization History  Administered Date(s) Administered  . Influenza,inj,Quad PF,36+ Mos 02/28/2013   Prior to Admission medications   Medication Sig Start Date End Date Taking? Authorizing Provider  aspirin EC 81 MG EC tablet Take 1 tablet (81 mg total) by mouth daily. 03/15/13  Yes Brittainy Simmons, PA-C  atorvastatin (LIPITOR) 40 MG tablet Take 1 tablet (40 mg total) by mouth daily at 6 PM. 03/15/13  Yes Brittainy Sharol Harness, PA-C  fenofibrate 160 MG tablet Take 1 tablet (160 mg total) by mouth daily. 01/06/13  Yes Ileana Ladd, MD  furosemide (LASIX) 20 MG tablet Take 20-40 mg by mouth 2 (two) times daily. Take two tablets by mouth daily in the morning and one tablet in the evening   Yes Historical Provider, MD  gabapentin (NEURONTIN) 100 MG capsule Take 100 mg by mouth 2 (two) times daily.    Yes Historical Provider, MD  hydroxyurea (HYDREA) 500 MG capsule Take 500 mg by mouth every other day. May take with food to minimize GI side effects.   Yes Historical Provider, MD  loratadine (CLARITIN) 10 MG tablet Take 10 mg by mouth daily.   Yes Historical Provider, MD  metoprolol tartrate (LOPRESSOR) 25 MG tablet Take 25 mg by mouth 2 (two) times daily.   Yes Historical Provider, MD  Multiple Vitamins-Minerals (CERTA-VITE SENIOR-LUTEIN PO) Take 1 tablet by mouth every morning.   Yes Historical Provider, MD  nitroGLYCERIN (NITRODUR - DOSED IN MG/24 HR) 0.2 mg/hr Place 1 patch onto the skin daily.   Yes Historical Provider, MD  omeprazole (PRILOSEC) 20 MG capsule Take 20 mg by mouth daily.     Yes Historical Provider, MD  polyethylene glycol powder (MIRALAX) powder Take 17 g by mouth daily as needed.   Yes Historical Provider, MD  potassium chloride (KLOR-CON) 20 MEQ packet Take 20 mEq by mouth daily.    Yes Historical  Provider, MD  risperiDONE (RISPERDAL) 1 MG tablet Take 1 mg by mouth 2 (two) times daily.   Yes Historical Provider, MD  senna (  SENOKOT) 8.6 MG TABS Take 2 tablets by mouth.   Yes Historical Provider, MD  Tamsulosin HCl (FLOMAX) 0.4 MG CAPS Take 0.4 mg by mouth every evening.    Yes Historical Provider, MD  temazepam (RESTORIL) 7.5 MG capsule Take 1 capsule (7.5 mg total) by mouth every evening. 6pm 02/08/13  Yes Ileana Ladd, MD  vitamin B-12 (CYANOCOBALAMIN) 1000 MCG tablet Take 1,000 mcg by mouth daily.     Yes Historical Provider, MD  warfarin (COUMADIN) 2 MG tablet Take 2mg  daily except on Wednesdays and Saturdays take 4mg .  **Corrected Dosage** 02/28/13  Yes [name] Eckard, PHARMD      ROS: As above in the HPI. All other systems are stable or negative.  OBJECTIVE: APPEARANCE:  Patient in no acute distress.The patient appeared well nourished and normally developed. Acyanotic. Waist: VITAL SIGNS:BP 104/55  Pulse 75  Temp(Src) 97.8 F (36.6 C) (Oral)  Wt 220 lb 6.4 oz (99.973 kg)  BMI 27.55 kg/m2 Elderly frail WM Ambulates with a walker. Her with a Production manager.   SKIN: warm and  Dry without overt rashes, tattoos and scars  HEAD and Neck: without JVD, Head and scalp: normal Eyes:No scleral icterus. Fundi normal, eye movements normal. Ears: Auricle normal, canal normal, Tympanic membranes normal, insufflation normal. Nose: normal Throat: normal Neck & thyroid: normal  CHEST & LUNGS: Chest wall: normal Lungs: Clear  CVS: Reveals the PMI to be normally located. Regular rhythm, First and Second Heart sounds are normal,  absence of murmurs, rubs or gallops. Peripheral vasculature: Radial pulses: normal Dorsal pedis pulses: reduced Posterior pulses: reduced  ABDOMEN:  Appearance: normal Benign, no organomegaly, no masses, no Abdominal Aortic enlargement. No Guarding , no rebound. No Bruits. Bowel sounds: normal  RECTAL: N/A GU: N/A  EXTREMETIES:  nonedematous.  MUSCULOSKELETAL:  Spine: normal Joints: left shoulder painful arc. Pain on attempts to internally and externally rotate.  Reviewed his hospital Xray: was unremarkable.  NEUROLOGIC: oriented to,place and person; nonfocal in exteremities  ASSESSMENT: Left shoulder pain  Atrial fibrillation - Plan: POCT INR  Depression  Diastolic heart failure  Hypertrophic cardiomyopathy  Long term (current) use of anticoagulants  Thrombocytosis  Bipolar disorder, unspecified  Cardiac pacemaker in situ  CKD (chronic kidney disease)  CKD (chronic kidney disease) stage 3, GFR 30-59 ml/min  Suspect that he has a rotator cuff tendinopathy.  PLAN:  Ordered PT at [facility] to use Mucinex prn nasal congestion  Orders Placed This Encounter  Procedures  . POCT INR   Results for orders placed in visit on 03/21/13  POCT INR      Result Value Range   INR 3.1      Recheck protime in 2 weeks.  Return in about 2 weeks (around 04/04/2013) for for coumadin clinic and 3 months with Dr Modesto Schmidt.  [name], M.D.

## 2013-03-23 ENCOUNTER — Ambulatory Visit (INDEPENDENT_AMBULATORY_CARE_PROVIDER_SITE_OTHER): Payer: Medicare Other | Admitting: Physician Assistant

## 2013-03-23 ENCOUNTER — Encounter: Payer: Self-pay | Admitting: Physician Assistant

## 2013-03-23 VITALS — BP 100/58 | HR 75 | Ht 75.0 in | Wt 218.6 lb

## 2013-03-23 DIAGNOSIS — R079 Chest pain, unspecified: Secondary | ICD-10-CM

## 2013-03-23 DIAGNOSIS — R0600 Dyspnea, unspecified: Secondary | ICD-10-CM | POA: Insufficient documentation

## 2013-03-23 DIAGNOSIS — I4891 Unspecified atrial fibrillation: Secondary | ICD-10-CM

## 2013-03-23 DIAGNOSIS — R0609 Other forms of dyspnea: Secondary | ICD-10-CM

## 2013-03-23 NOTE — Patient Instructions (Signed)
Followup with Dr. Hilty in 6 months 

## 2013-03-23 NOTE — Assessment & Plan Note (Signed)
Likely related to deconditioning and inactivity.

## 2013-03-23 NOTE — Assessment & Plan Note (Signed)
The patients chest pain and shoulder pain is musculoskeletal and likely from arthritis .  The shoulder is worse with abduction.  At most is is 3/10 and he does not appear to be uncomfortable.  Given his CKD, I would avoid nsaids.

## 2013-03-23 NOTE — Progress Notes (Signed)
Date:  03/23/2013   ID:  [name], DOB December 10, 1924, MRN [id number]  PCP:  Redmond Baseman, MD  Primary Cardiologist:  [facility]     History of Present Illness: [name] is a 79 y.o. male with a history of atrial fibrillation, on chronic anticoagulation with Warfarin. He also has a PPM. He presented to the [facility] ER on 03/13/13 with a complaint of chest pain. His chest pain symptoms had both mixed typical and atypical features. His pain was dull/achy pain that radiated to the left shoulder, but was also worse with movement and also pleuritic. His EKG and CXR were both unremarkable. However, he was admitted for observation and further work-up. Cardiac enzymes were cycled and were negative x 3. His pain ultimately resolved. He underwent a nuclear stress test and was found to have no significant ischemia. His EF on the NST was 46%, however was normal on 2D echo. It was felt that his pain was non cardiac and likely musculoskeletal.  The patient presents today for a follow up evaluation.  He reports continued intermittent chest /left shoulder pain which is worse with lifting his arm.  He also reports being short winded particularly with very little activity.  The patient currently denies nausea, vomiting, fever, orthopnea, dizziness, PND, cough, congestion, abdominal pain, hematochezia, melena, lower extremity edema.  Wt Readings from Last 3 Encounters:  03/23/13 218 lb 9.6 oz (99.156 kg)  03/21/13 220 lb 6.4 oz (99.973 kg)  03/13/13 218 lb 0.6 oz (98.9 kg)     Past Medical History  Diagnosis Date  . Anemia   . Chronic constipation   . Bloating   . Colonic neoplasm   . Occult blood positive stool   . A-fib   . Hypertrophic cardiomyopathy   . Bipolar disorder   . Diastolic heart failure   . Pacemaker     placed and [facility] at [facility]  . Depression   . Chronic kidney disease   . Thrombocytosis   . CKD (chronic kidney disease) stage 3, GFR 30-59 ml/min 03/16/2013    Current  Outpatient Prescriptions  Medication Sig Dispense Refill  . aspirin EC 81 MG EC tablet Take 1 tablet (81 mg total) by mouth daily.      Marland Kitchen atorvastatin (LIPITOR) 40 MG tablet Take 1 tablet (40 mg total) by mouth daily at 6 PM.  30 tablet  5  . fenofibrate 160 MG tablet Take 1 tablet (160 mg total) by mouth daily.  30 tablet  5  . furosemide (LASIX) 20 MG tablet Take 20-40 mg by mouth 2 (two) times daily. Take two tablets by mouth daily in the morning and one tablet in the evening      . gabapentin (NEURONTIN) 100 MG capsule Take 100 mg by mouth 2 (two) times daily.       . hydroxyurea (HYDREA) 500 MG capsule Take 500 mg by mouth every other day. May take with food to minimize GI side effects.      Marland Kitchen loratadine (CLARITIN) 10 MG tablet Take 10 mg by mouth daily.      . metoprolol tartrate (LOPRESSOR) 25 MG tablet Take 25 mg by mouth 2 (two) times daily.      . Multiple Vitamins-Minerals (CERTA-VITE SENIOR-LUTEIN PO) Take 1 tablet by mouth every morning.      . nitroGLYCERIN (NITRODUR - DOSED IN MG/24 HR) 0.2 mg/hr Place 1 patch onto the skin daily.      Marland Kitchen omeprazole (PRILOSEC) 20 MG capsule Take 20  mg by mouth daily.        . polyethylene glycol powder (MIRALAX) powder Take 17 g by mouth daily as needed.      . potassium chloride (KLOR-CON) 20 MEQ packet Take 20 mEq by mouth daily.       . risperiDONE (RISPERDAL) 1 MG tablet Take 1 mg by mouth 2 (two) times daily.      Marland Kitchen senna (SENOKOT) 8.6 MG TABS Take 2 tablets by mouth.      . Tamsulosin HCl (FLOMAX) 0.4 MG CAPS Take 0.4 mg by mouth every evening.       . temazepam (RESTORIL) 7.5 MG capsule Take 1 capsule (7.5 mg total) by mouth every evening. 6pm  30 capsule  1  . vitamin B-12 (CYANOCOBALAMIN) 1000 MCG tablet Take 1,000 mcg by mouth daily.        Marland Kitchen warfarin (COUMADIN) 2 MG tablet Take 2mg  daily except on Wednesdays and Saturdays take 4mg .  **Corrected Dosage**  40 tablet  0   No current facility-administered medications for this visit.     Allergies:    Allergies  Allergen Reactions  . Abilify [Aripiprazole]     Other reaction(s): Other (See Comments) Hallucinations  . Demerol   . Meperidine     Other reaction(s): Mental Status Changes (intolerance)  . Morphine Other (See Comments)    Hallucinations  . Morphine And Related   . Risperdal [Risperidone] Other (See Comments)    Weakness, tiredness    Social History:  The patient  reports that he quit smoking about 40 years ago. He does not have any smokeless tobacco history on file. He reports that he does not drink alcohol or use illicit drugs.   Family history:   Family History  Problem Relation Age of Onset  . Heart disease Mother   . Heart disease Sister   . Heart disease Brother     ROS:  Please see the history of present illness.  All other systems reviewed and negative.   PHYSICAL EXAM: VS:  BP 100/58  Pulse 75  Ht 6\' 3"  (1.905 m)  Wt 218 lb 9.6 oz (99.156 kg)  BMI 27.32 kg/m2 Well nourished, well developed, in no acute distress HEENT: Pupils are equal round react to light accommodation extraocular movements are intact.  Neck: no JVDNo cervical lymphadenopathy. Cardiac: Regular rate and rhythm with 1/6 sys MM Lungs:  clear to auscultation bilaterally, no wheezing, rhonchi or rales Abd: soft, nontender, positive bowel sounds all quadrants Ext: no lower extremity edema.  2+ radial and dorsalis pedis pulses. Skin: warm and dry Neuro:  Grossly normal  EKG:  V paced rhythm 75    ASSESSMENT AND PLAN:  Problem List Items Addressed This Visit   Dyspnea     Likely related to deconditioning and inactivity.    Chest pain with low risk for cardiac etiology- low risk stress test; likely musculoskeltal      The patients chest pain and shoulder pain is musculoskeletal and likely from arthritis .  The shoulder is worse with abduction.  At most is is 3/10 and he does not appear to be uncomfortable.  Given his CKD, I would avoid nsaids.     Other Visit  Diagnoses   A-fib    -  Primary    Relevant Orders       EKG 12-Lead

## 2013-03-24 ENCOUNTER — Telehealth: Payer: Self-pay | Admitting: Family Medicine

## 2013-03-24 NOTE — Telephone Encounter (Signed)
Left message on [name]'s VM about next appt for [name].  Appt made for 04/04/13 at 3:20pm.

## 2013-03-30 ENCOUNTER — Encounter: Payer: Self-pay | Admitting: Physician Assistant

## 2013-04-04 ENCOUNTER — Ambulatory Visit (INDEPENDENT_AMBULATORY_CARE_PROVIDER_SITE_OTHER): Payer: Medicare Other | Admitting: Pharmacist

## 2013-04-04 DIAGNOSIS — Z7901 Long term (current) use of anticoagulants: Secondary | ICD-10-CM

## 2013-04-04 DIAGNOSIS — I4891 Unspecified atrial fibrillation: Secondary | ICD-10-CM

## 2013-04-04 LAB — POCT INR: INR: 3.1

## 2013-04-04 NOTE — Patient Instructions (Signed)
Anticoagulation Dose Instructions as of 04/04/2013     [name] Tue Wed Thu Fri Sat   New Dose 2 mg 2 mg 2 mg 2 mg 2 mg 2 mg 4 mg    Description       Start 2mg  daily except 4mg  on Saturdays      INR was 3.1 today

## 2013-04-06 ENCOUNTER — Ambulatory Visit: Payer: Medicare Other | Admitting: Family Medicine

## 2013-04-08 ENCOUNTER — Ambulatory Visit: Payer: Medicare Other | Admitting: Family Medicine

## 2013-04-12 ENCOUNTER — Encounter: Payer: Self-pay | Admitting: Internal Medicine

## 2013-04-12 ENCOUNTER — Telehealth: Payer: Self-pay | Admitting: Internal Medicine

## 2013-04-12 NOTE — Telephone Encounter (Signed)
04-12-13 N/A, NO VOICEMAIL, SENT LETTER TO SCHEDULE AN APPT WITH [facility], FORMER [facility] PT/MT

## 2013-05-03 ENCOUNTER — Other Ambulatory Visit: Payer: Self-pay

## 2013-05-03 NOTE — Telephone Encounter (Signed)
Last seen 03/21/13  [facility]     If approved call into [facility]  [phone]

## 2013-05-04 MED ORDER — TEMAZEPAM 7.5 MG PO CAPS: 7.5000 mg | ORAL_CAPSULE | Freq: Every evening | ORAL | Status: DC

## 2013-05-04 NOTE — Telephone Encounter (Signed)
Rx ready for nurse to Phone in. 

## 2013-05-04 NOTE — Telephone Encounter (Signed)
At the pharmacy to refill Rx no one pick up left number to call back if any qeustions

## 2013-05-10 ENCOUNTER — Ambulatory Visit (INDEPENDENT_AMBULATORY_CARE_PROVIDER_SITE_OTHER): Payer: Medicare Other | Admitting: Pharmacist Clinician (PhC)/ Clinical Pharmacy Specialist

## 2013-05-10 DIAGNOSIS — Z7901 Long term (current) use of anticoagulants: Secondary | ICD-10-CM

## 2013-05-10 DIAGNOSIS — I4891 Unspecified atrial fibrillation: Secondary | ICD-10-CM

## 2013-05-10 LAB — POCT INR: INR: 2.2

## 2013-05-12 ENCOUNTER — Telehealth: Payer: Self-pay | Admitting: *Deleted

## 2013-05-12 NOTE — Telephone Encounter (Signed)
Faxed signed homehealth certification and POC back.

## 2013-06-13 ENCOUNTER — Ambulatory Visit (INDEPENDENT_AMBULATORY_CARE_PROVIDER_SITE_OTHER): Payer: Medicare Other | Admitting: Pharmacist

## 2013-06-13 DIAGNOSIS — Z7901 Long term (current) use of anticoagulants: Secondary | ICD-10-CM

## 2013-06-13 DIAGNOSIS — I4891 Unspecified atrial fibrillation: Secondary | ICD-10-CM

## 2013-06-13 LAB — POCT INR: INR: 2.5

## 2013-06-13 NOTE — Patient Instructions (Signed)
Anticoagulation Dose Instructions as of 06/13/2013     [name] Tue Wed Thu Fri Sat   New Dose 2 mg 2 mg 2 mg 2 mg 2 mg 2 mg 4 mg    Description       Continue 2mg  daily except 4mg  on Saturdays.      INR was 2.5 today

## 2013-06-21 ENCOUNTER — Telehealth: Payer: Self-pay | Admitting: *Deleted

## 2013-06-21 ENCOUNTER — Other Ambulatory Visit: Payer: Self-pay

## 2013-06-21 MED ORDER — TEMAZEPAM 7.5 MG PO CAPS: 7.5000 mg | ORAL_CAPSULE | Freq: Every evening | ORAL | Status: DC

## 2013-06-21 NOTE — Telephone Encounter (Signed)
Last seen 03/21/13  FPW  If approved route to nurse to call into [facility] [phone]

## 2013-06-21 NOTE — Telephone Encounter (Signed)
RX for Restoril called into pharmacy

## 2013-06-21 NOTE — Telephone Encounter (Signed)
Rx ready for nurse to Phone in. 

## 2013-07-13 ENCOUNTER — Ambulatory Visit (INDEPENDENT_AMBULATORY_CARE_PROVIDER_SITE_OTHER): Payer: Medicare Other | Admitting: Cardiology

## 2013-07-13 ENCOUNTER — Ambulatory Visit (INDEPENDENT_AMBULATORY_CARE_PROVIDER_SITE_OTHER): Payer: Medicare Other | Admitting: Pharmacist

## 2013-07-13 ENCOUNTER — Encounter: Payer: Self-pay | Admitting: Cardiology

## 2013-07-13 VITALS — BP 119/72 | HR 75 | Ht 74.0 in | Wt 206.0 lb

## 2013-07-13 DIAGNOSIS — I422 Other hypertrophic cardiomyopathy: Secondary | ICD-10-CM

## 2013-07-13 DIAGNOSIS — I4891 Unspecified atrial fibrillation: Secondary | ICD-10-CM

## 2013-07-13 DIAGNOSIS — Z7901 Long term (current) use of anticoagulants: Secondary | ICD-10-CM

## 2013-07-13 DIAGNOSIS — I5030 Unspecified diastolic (congestive) heart failure: Secondary | ICD-10-CM

## 2013-07-13 LAB — POCT INR: INR: 2.3

## 2013-07-13 NOTE — Progress Notes (Signed)
HPI The patient presents for follow up of chest pain and atrial fibrillation.  He was hospitalized in October with chest pain. Cardiac enzymes were cycled and were negative x 3. His pain ultimately resolved. He underwent a nuclear stress test and was found to have no significant ischemia. His EF was 46%, however it was normal on 2D echo. It was felt that his pain was non cardiac and likely musculoskeletal.  He presents for his first appointment with me although he is not new to our practice.  He lives in this area.  He continues to have chest discomfort. However, on further questioning this is still with movement of his left arm as previously described. It is unchanged from the pain he had at the time of his stress test. He's not had any new shortness of breath, PND or orthopnea. He's not having any new palpitations, presyncope or syncope. He has had no new weight gain or edema.   Allergies  Allergen Reactions  . Abilify [Aripiprazole]     Other reaction(s): Other (See Comments) Hallucinations  . Demerol   . Meperidine     Other reaction(s): Mental Status Changes (intolerance)  . Morphine Other (See Comments)    Hallucinations  . Morphine And Related   . Risperdal [Risperidone] Other (See Comments)    Weakness, tiredness    Current Outpatient Prescriptions  Medication Sig Dispense Refill  . aspirin EC 81 MG EC tablet Take 1 tablet (81 mg total) by mouth daily.      Marland Kitchen atorvastatin (LIPITOR) 40 MG tablet Take 1 tablet (40 mg total) by mouth daily at 6 PM.  30 tablet  5  . fenofibrate 160 MG tablet Take 1 tablet (160 mg total) by mouth daily.  30 tablet  5  . furosemide (LASIX) 20 MG tablet Take 20-40 mg by mouth 2 (two) times daily. Take two tablets by mouth daily in the morning and one tablet in the evening      . gabapentin (NEURONTIN) 100 MG capsule Take 100 mg by mouth 2 (two) times daily.       . hydroxyurea (HYDREA) 500 MG capsule Take 500 mg by mouth every other day. May take  with food to minimize GI side effects.      Marland Kitchen loratadine (CLARITIN) 10 MG tablet Take 10 mg by mouth daily.      . metoprolol tartrate (LOPRESSOR) 25 MG tablet Take 25 mg by mouth 2 (two) times daily.      . Multiple Vitamins-Minerals (CERTA-VITE SENIOR-LUTEIN PO) Take 1 tablet by mouth every morning.      . nitroGLYCERIN (NITRODUR - DOSED IN MG/24 HR) 0.2 mg/hr Place 1 patch onto the skin daily.      Marland Kitchen omeprazole (PRILOSEC) 20 MG capsule Take 20 mg by mouth daily.        . potassium chloride (KLOR-CON) 20 MEQ packet Take 20 mEq by mouth daily.       . risperiDONE (RISPERDAL) 1 MG tablet Take 1 mg by mouth 2 (two) times daily.      Marland Kitchen senna (SENOKOT) 8.6 MG TABS Take 2 tablets by mouth.      . Tamsulosin HCl (FLOMAX) 0.4 MG CAPS Take 0.4 mg by mouth every evening.       . temazepam (RESTORIL) 7.5 MG capsule Take 1 capsule (7.5 mg total) by mouth every evening. 6pm  30 capsule  1  . vitamin B-12 (CYANOCOBALAMIN) 1000 MCG tablet Take 1,000 mcg by mouth daily.        Marland Kitchen  warfarin (COUMADIN) 2 MG tablet Take 2mg  daily except on Wednesdays and Saturdays take 4mg .  **Corrected Dosage**  40 tablet  0  . polyethylene glycol powder (MIRALAX) powder Take 17 g by mouth daily as needed.       No current facility-administered medications for this visit.    Past Medical History  Diagnosis Date  . Anemia   . Chronic constipation   . Bloating   . Colonic neoplasm   . Occult blood positive stool   . A-fib   . Hypertrophic cardiomyopathy   . Bipolar disorder   . Diastolic heart failure   . Pacemaker     placed and followed at [facility]  . Depression   . Chronic kidney disease   . Thrombocytosis   . CKD (chronic kidney disease) stage 3, GFR 30-59 ml/min 03/16/2013    Past Surgical History  Procedure Laterality Date  . Colonoscopy  10/02/2010    W/[facility]  . Fracture surgery    . Pacemaker insertion      [facility]  . Cholecystectomy      ROS:  Arthritis.  As stated in the HPI and negative for all  other systems.  PHYSICAL EXAM BP 119/72  Pulse 75  Ht 6\' 2"  (1.88 m)  Wt 206 lb (93.441 kg)  BMI 26.44 kg/m2 PHYSICAL EXAM GEN:  No distress, frail NECK:  No jugular venous distention at 90 degrees, waveform within normal limits, carotid upstroke brisk and symmetric, no bruits, no thyromegaly LYMPHATICS:  No cervical adenopathy LUNGS:  Clear to auscultation bilaterally BACK:  No CVA tenderness CHEST:  Unremarkable, healed pacemaker pocket.  HEART:  S1 and S2 within normal limits, no S3, no S4, no clicks, no rubs, no murmurs ABD:  Positive bowel sounds normal in frequency in pitch, no bruits, no rebound, no guarding, unable to assess midline mass or bruit with the patient seated. EXT:  2 plus pulses throughout, moderate edema, no cyanosis no clubbing SKIN:  No rashes no nodules NEURO:  Cranial nerves II through XII grossly intact, motor grossly intact throughout, muscle wasting PSYCH:  Cognitively intact, oriented to person place and time   EKG:  Atrial fibrillation with ventricular pacing 100% capture, rate 75  07/13/2013  ASSESSMENT AND PLAN  ATRIAL FIBRILLATION:  The patient doesn't notice this rhythm. He states. He tolerates his anticoagulation. No change in therapy is indicated.  DIASTOLIC HF:  He seems to be euvolemic. His driver's says that they do restrict the salt. I will defer any lab followup to his upcoming appointment with Dr. Jacelyn Grip  CKD:  This has been stable but not checked since October and again I will defer as above.  PACEMAKER PLACEMENT:  I'll make sure he has followup established in our clinic as he was previously followed at [facility]:  Given the fact that this is atypical and unchanged from previous when he had his negative workup no further cardiac workup is suggested.

## 2013-07-13 NOTE — Patient Instructions (Signed)
Anticoagulation Dose Instructions as of 07/13/2013     [name] Tue Wed Thu Fri Sat   New Dose 2 mg 2 mg 2 mg 2 mg 2 mg 2 mg 4 mg    Description       Continue 2mg  daily except 4mg  on Saturdays.      INR was 2.3 today

## 2013-07-13 NOTE — Patient Instructions (Signed)
The current medical regimen is effective;  continue present plan and medications.  Follow up in 1 year with Dr Hochrein.  You will receive a letter in the mail 2 months before you are due.  Please call us when you receive this letter to schedule your follow up appointment.  

## 2013-08-18 ENCOUNTER — Encounter: Payer: Self-pay | Admitting: Pharmacist

## 2013-08-18 ENCOUNTER — Ambulatory Visit (INDEPENDENT_AMBULATORY_CARE_PROVIDER_SITE_OTHER): Payer: Medicare Other | Admitting: Pharmacist

## 2013-08-18 VITALS — BP 120/68 | HR 68 | Ht 74.0 in | Wt 205.0 lb

## 2013-08-18 DIAGNOSIS — Z0000 Encounter for general adult medical examination without abnormal findings: Secondary | ICD-10-CM

## 2013-08-18 DIAGNOSIS — Z7901 Long term (current) use of anticoagulants: Secondary | ICD-10-CM

## 2013-08-18 DIAGNOSIS — I4891 Unspecified atrial fibrillation: Secondary | ICD-10-CM

## 2013-08-18 DIAGNOSIS — H9190 Unspecified hearing loss, unspecified ear: Secondary | ICD-10-CM

## 2013-08-18 LAB — POCT INR: INR: 2.5

## 2013-08-18 NOTE — Progress Notes (Signed)
Subjective:    [name] is a 80 y.o. male who presents for Medicare Initial preventive examination.   Preventive Screening-Counseling & Management  Tobacco History  Smoking status  . Former Smoker  . Quit date: 10/05/1972  Smokeless tobacco  . Never Used    Current Problems (verified) Patient Active Problem List   Diagnosis Date Noted  . Dyspnea 03/23/2013  . Left shoulder pain 03/21/2013  . CKD (chronic kidney disease) stage 3, GFR 30-59 ml/min 03/16/2013  . Chest pain with low risk for cardiac etiology- low risk stress test; likely musculoskeltal  03/15/2013  . Chest pain 03/14/2013  . Shortness of breath 03/14/2013  . CKD (chronic kidney disease) 01/04/2013  . Anemia 01/04/2013  . Hypertrophic cardiomyopathy 01/04/2013  . Bipolar disorder, unspecified 01/04/2013  . Diastolic heart failure [id number]  . Cardiac pacemaker in situ 01/04/2013  . Thrombocytosis 01/04/2013  . Depression 01/04/2013  . Long term (current) use of anticoagulants 10/06/2012  . Atrial fibrillation 10/05/2012  . ACL tear 10/05/2012    Medications Prior to Visit Current Outpatient Prescriptions on File Prior to Visit  Medication Sig Dispense Refill  . aspirin EC 81 MG EC tablet Take 1 tablet (81 mg total) by mouth daily.      Marland Kitchen atorvastatin (LIPITOR) 40 MG tablet Take 1 tablet (40 mg total) by mouth daily at 6 PM.  30 tablet  5  . fenofibrate 160 MG tablet Take 1 tablet (160 mg total) by mouth daily.  30 tablet  5  . furosemide (LASIX) 20 MG tablet Take 20-40 mg by mouth 2 (two) times daily. Take two tablets by mouth daily in the morning and one tablet in the evening      . gabapentin (NEURONTIN) 100 MG capsule Take 100 mg by mouth 2 (two) times daily.       . hydroxyurea (HYDREA) 500 MG capsule Take 500 mg by mouth every other day. May take with food to minimize GI side effects.      Marland Kitchen loratadine (CLARITIN) 10 MG tablet Take 10 mg by mouth daily.      . metoprolol tartrate (LOPRESSOR) 25  MG tablet Take 25 mg by mouth 2 (two) times daily.      . Multiple Vitamins-Minerals (CERTA-VITE SENIOR-LUTEIN PO) Take 1 tablet by mouth every morning.      . nitroGLYCERIN (NITRODUR - DOSED IN MG/24 HR) 0.2 mg/hr Place 1 patch onto the skin daily.      Marland Kitchen omeprazole (PRILOSEC) 20 MG capsule Take 20 mg by mouth daily.        . potassium chloride (KLOR-CON) 20 MEQ packet Take 20 mEq by mouth daily.       . risperiDONE (RISPERDAL) 1 MG tablet Take 1 mg by mouth 2 (two) times daily.      Marland Kitchen senna (SENOKOT) 8.6 MG TABS Take 2 tablets by mouth.      . Tamsulosin HCl (FLOMAX) 0.4 MG CAPS Take 0.4 mg by mouth every evening.       . temazepam (RESTORIL) 7.5 MG capsule Take 1 capsule (7.5 mg total) by mouth every evening. 6pm  30 capsule  1  . vitamin B-12 (CYANOCOBALAMIN) 1000 MCG tablet Take 1,000 mcg by mouth daily.        Marland Kitchen warfarin (COUMADIN) 2 MG tablet Take 2mg  daily except on Wednesdays and Saturdays take 4mg .  **Corrected Dosage**  40 tablet  0  . polyethylene glycol powder (MIRALAX) powder Take 17 g by mouth daily as needed.  No current facility-administered medications on file prior to visit.    Current Medications (verified) Current Outpatient Prescriptions  Medication Sig Dispense Refill  . aspirin EC 81 MG EC tablet Take 1 tablet (81 mg total) by mouth daily.      Marland Kitchen atorvastatin (LIPITOR) 40 MG tablet Take 1 tablet (40 mg total) by mouth daily at 6 PM.  30 tablet  5  . fenofibrate 160 MG tablet Take 1 tablet (160 mg total) by mouth daily.  30 tablet  5  . furosemide (LASIX) 20 MG tablet Take 20-40 mg by mouth 2 (two) times daily. Take two tablets by mouth daily in the morning and one tablet in the evening      . gabapentin (NEURONTIN) 100 MG capsule Take 100 mg by mouth 2 (two) times daily.       . hydroxyurea (HYDREA) 500 MG capsule Take 500 mg by mouth every other day. May take with food to minimize GI side effects.      Marland Kitchen loratadine (CLARITIN) 10 MG tablet Take 10 mg by mouth  daily.      . metoprolol tartrate (LOPRESSOR) 25 MG tablet Take 25 mg by mouth 2 (two) times daily.      . Multiple Vitamins-Minerals (CERTA-VITE SENIOR-LUTEIN PO) Take 1 tablet by mouth every morning.      . nitroGLYCERIN (NITRODUR - DOSED IN MG/24 HR) 0.2 mg/hr Place 1 patch onto the skin daily.      Marland Kitchen omeprazole (PRILOSEC) 20 MG capsule Take 20 mg by mouth daily.        . potassium chloride (KLOR-CON) 20 MEQ packet Take 20 mEq by mouth daily.       . risperiDONE (RISPERDAL) 1 MG tablet Take 1 mg by mouth 2 (two) times daily.      Marland Kitchen senna (SENOKOT) 8.6 MG TABS Take 2 tablets by mouth.      . Tamsulosin HCl (FLOMAX) 0.4 MG CAPS Take 0.4 mg by mouth every evening.       . temazepam (RESTORIL) 7.5 MG capsule Take 1 capsule (7.5 mg total) by mouth every evening. 6pm  30 capsule  1  . vitamin B-12 (CYANOCOBALAMIN) 1000 MCG tablet Take 1,000 mcg by mouth daily.        Marland Kitchen warfarin (COUMADIN) 2 MG tablet Take 2mg  daily except on Wednesdays and Saturdays take 4mg .  **Corrected Dosage**  40 tablet  0  . polyethylene glycol powder (MIRALAX) powder Take 17 g by mouth daily as needed.       No current facility-administered medications for this visit.     Allergies (verified) Abilify; Demerol; Meperidine; Morphine; Morphine and related; and Risperdal   PAST HISTORY  Family History Family History  Problem Relation Age of Onset  . Heart disease Mother   . Heart disease Sister   . Heart disease Brother     Social History History  Substance Use Topics  . Smoking status: Former Smoker    Quit date: 10/05/1972  . Smokeless tobacco: Never Used  . Alcohol Use: No    Are there smokers in your home (other than you)?  No Patient is a resident at assisted living facility - [facility] in [place], [place]  Risk Factors Current exercise habits: Exercise is limited by orthopedic condition(s): acl tear.  Dietary issues discussed: none   Cardiac risk factors: advanced age (older than 8 for men, 77 for  women), dyslipidemia, hypertension, male gender and sedentary lifestyle.  Depression Screen (Note: if answer to either  of the following is "Yes", a more complete depression screening is indicated)   Q1: Over the past two weeks, have you felt down, depressed or hopeless? No  Q2: Over the past two weeks, have you felt little interest or pleasure in doing things? No  Have you lost interest or pleasure in daily life? No  Do you often feel hopeless? No  Do you cry easily over simple problems? No  Activities of Daily Living In your present state of health, do you have any difficulty performing the following activities?:  Driving? Yes - patient no longer drives Managing money?  No Feeding yourself? No Getting from bed to chair? No Climbing a flight of stairs? Yes Preparing food and eating?: Yes Bathing or showering? No Getting dressed: No Getting to the toilet? No Using the toilet:No Moving around from place to place: No In the past year have you fallen or had a near fall?:No   Are you sexually active?  No  Do you have more than one partner?  No  Hearing Difficulties: Yes Do you often ask people to speak up or repeat themselves? Yes Do you experience ringing or noises in your ears? No Do you have difficulty understanding soft or whispered voices? Yes   Do you feel that you have a problem with memory? Yes  Do you often misplace items? No  Do you feel safe at home?  Yes (note patient lives in assisted living facility  Cognitive Testing  Alert? Yes  Normal Appearance?Yes  Oriented to person? Yes  Place? Yes   Time? No - patient stated it was 2014  Recall of three objects?  No  Can perform simple calculations? No  Displays appropriate judgment?Yes  Can read the correct time from a watch face?Yes   Advanced Directives have been discussed with the patient? No   List the Names of Other Physician/Practitioners you currently use: 1.    Indicate any recent Medical Services you may  have received from other than [facility] providers in the past year (date may be approximate).  Immunization History  Administered Date(s) Administered  . Influenza,inj,Quad PF,36+ Mos 02/28/2013    Screening Tests Health Maintenance  Topic Date Due  . Tetanus/tdap  08/09/1945  . Colonoscopy  08/09/1976  . Zostavax  08/10/1986  . Pneumococcal Polysaccharide Vaccine Age 59 And Over  08/10/1991  . Influenza Vaccine  01/07/2014    All answers were reviewed with the patient and necessary referrals were made:  Cherre Robins, [facility]   08/18/2013   History reviewed: allergies, current medications, past family history, past medical history, past social history, past surgical history and problem list    Objective:      Blood pressure 120/68, pulse 68, height 6\' 2"  (1.88 m), weight 205 lb (92.987 kg). Body mass index is 26.31 kg/(m^2). INR was 2.5 today       Assessment:     Medicare Annual Wellness Visit Therapeutic anticoagulation      Plan:     During the course of the visit the patient was educated and counseled about appropriate screening and preventive services including:    Pneumococcal vaccine   Influenza vaccine  Td vaccine  Colorectal cancer screening  Diabetes screening  Glaucoma screening  Nutrition counseling   checked on cost of Boostrix, Zostavax and Prevnar for patient - all would cost $3.60 as of today - patinet has declined to get today.  Referral for audiology assessment made  Diet review for nutrition referral? Not Indicated Trying to  find out when last colonoscopy performed - patient was sure in last 5 years but cannot find in paper or e chart.  Patient is not able to give great detail about recent or past medical procedures.   Cherre Robins, PharmD, CPP    Patient Instructions (the written plan) was given to the patient.  Medicare Attestation I have personally reviewed: The patient's medical and social history Their use of alcohol,  tobacco or illicit drugs Their current medications and supplements The patient's functional ability including ADLs,fall risks, home safety risks, cognitive, and hearing and visual impairment Diet and physical activities Evidence for depression or mood disorders  The patient's weight, height, BMI, and BP/ have been recorded in the chart.  I have made referrals, counseling, and provided education to the patient based on review of the above and I have provided the patient with a written personalized care plan for preventive services.     Cherre Robins, [facility]   08/18/2013

## 2013-08-18 NOTE — Patient Instructions (Addendum)
Anticoagulation Dose Instructions as of 08/18/2013     Dorene Grebe Tue Wed Thu Fri Sat   New Dose 2 mg 2 mg 2 mg 2 mg 2 mg 2 mg 4 mg    Description       Continue $RemoveB'2mg'RdtnCYKJ$  daily except $RemoveBefo'4mg'lrJFoIdIrqX$  on Saturdays.      INR was 2.5 today   Health Maintenance Summary    TETANUS/TDAP Overdue [birth year]/1945  getting cost estimate for future administration    COLONOSCOPY Overdue 08/09/1976      ZOSTAVAX Overdue 08/10/1986  getting cost estimate for future adminsitration    PNEUMOCOCCAL POLYSACCHARIDE VACCINE AGE 78 AND OVER Overdue 08/10/1991  getting cost estimate for future admnsitration    INFLUENZA VACCINE Next Due 01/07/2014  last was 02/28/2013       Preventive Care for Adults, Male A healthy lifestyle and preventive care can promote health and wellness. Preventive health guidelines for men include the following key practices:  A routine yearly physical is a good way to check with your health care provider about your health and preventative screening. It is a chance to share any concerns and updates on your health and to receive a thorough exam.  Visit your dentist for a routine exam and preventative care every 6 months. Brush your teeth twice a day and floss once a day. Good oral hygiene prevents tooth decay and gum disease.  The frequency of eye exams is based on your age, health, family medical history, use of contact lenses, and other factors. Follow your health care provider's recommendations for frequency of eye exams.  Eat a healthy diet. Foods such as vegetables, fruits, whole grains, low-fat dairy products, and lean protein foods contain the nutrients you need without too many calories. Decrease your intake of foods high in solid fats, added sugars, and salt. Eat the right amount of calories for you.Get information about a proper diet from your health care provider, if necessary.  Regular physical exercise is one of the most important things you can do for your health. Most adults should get at least 150  minutes of moderate-intensity exercise (any activity that increases your heart rate and causes you to sweat) each week. In addition, most adults need muscle-strengthening exercises on 2 or more days a week.  Maintain a healthy weight. The body mass index (BMI) is a screening tool to identify possible weight problems. It provides an estimate of body fat based on height and weight. Your health care provider can find your BMI and can help you achieve or maintain a healthy weight.For adults 20 years and older:  A BMI below 18.5 is considered underweight.  A BMI of 18.5 to 24.9 is normal.  A BMI of 25 to 29.9 is considered overweight.  A BMI of 30 and above is considered obese.  Maintain normal blood lipids and cholesterol levels by exercising and minimizing your intake of saturated fat. Eat a balanced diet with plenty of fruit and vegetables. Blood tests for lipids and cholesterol should begin at age 59 and be repeated every 5 years. If your lipid or cholesterol levels are high, you are over 50, or you are at high risk for heart disease, you may need your cholesterol levels checked more frequently.Ongoing high lipid and cholesterol levels should be treated with medicines if diet and exercise are not working.  If you smoke, find out from your health care provider how to quit. If you do not use tobacco, do not start.  Lung cancer screening is recommended  for adults aged 89 80 years who are at high risk for developing lung cancer because of a history of smoking. A yearly low-dose CT scan of the lungs is recommended for people who have at least a 30-pack-year history of smoking and are a current smoker or have quit within the past 15 years. A pack year of smoking is smoking an average of 1 pack of cigarettes a day for 1 year (for example: 1 pack a day for 30 years or 2 packs a day for 15 years). Yearly screening should continue until the smoker has stopped smoking for at least 15 years. Yearly screening  should be stopped for people who develop a health problem that would prevent them from having lung cancer treatment.  If you choose to drink alcohol, do not have more than 2 drinks per day. One drink is considered to be 12 ounces (355 mL) of beer, 5 ounces (148 mL) of wine, or 1.5 ounces (44 mL) of liquor.  Avoid use of street drugs. Do not share needles with anyone. Ask for help if you need support or instructions about stopping the use of drugs.  High blood pressure causes heart disease and increases the risk of stroke. Your blood pressure should be checked at least every 1 2 years. Ongoing high blood pressure should be treated with medicines, if weight loss and exercise are not effective.  If you are 41 78 years old, ask your health care provider if you should take aspirin to prevent heart disease.  Diabetes screening involves taking a blood sample to check your fasting blood sugar level. This should be done once every 3 years, after age 50, if you are within normal weight and without risk factors for diabetes. Testing should be considered at a younger age or be carried out more frequently if you are overweight and have at least 1 risk factor for diabetes.  Colorectal cancer can be detected and often prevented. Most routine colorectal cancer screening begins at the age of 82 and continues through age 65. However, your health care provider may recommend screening at an earlier age if you have risk factors for colon cancer. On a yearly basis, your health care provider may provide home test kits to check for hidden blood in the stool. Use of a small camera at the end of a tube to directly examine the colon (sigmoidoscopy or colonoscopy) can detect the earliest forms of colorectal cancer. Talk to your health care provider about this at age 77, when routine screening begins. Direct exam of the colon should be repeated every 5 10 years through age 47, unless early forms of precancerous polyps or small  growths are found.  People who are at an increased risk for hepatitis B should be screened for this virus. You are considered at high risk for hepatitis B if:  You were born in a country where hepatitis B occurs often. Talk with your health care provider about which countries are considered high-risk.  Your parents were born in a high-risk country and you have not received a shot to protect against hepatitis B (hepatitis B vaccine).  You have HIV or AIDS.  You use needles to inject street drugs.  You live with, or have sex with, someone who has hepatitis B.  You are a man who has sex with other men (MSM).  You get hemodialysis treatment.  You take certain medicines for conditions such as cancer, organ transplantation, and autoimmune conditions.  Hepatitis C blood testing is  recommended for all people born from [birth year] through 1965 and any individual with known risks for hepatitis C.  Practice safe sex. Use condoms and avoid high-risk sexual practices to reduce the spread of sexually transmitted infections (STIs). STIs include gonorrhea, chlamydia, syphilis, trichomonas, herpes, HPV, and human immunodeficiency virus (HIV). Herpes, HIV, and HPV are viral illnesses that have no cure. They can result in disability, cancer, and death.  A one-time screening for abdominal aortic aneurysm (AAA) and surgical repair of large AAAs by ultrasound are recommended for men ages 33 to 81 years who are current or former smokers.  Healthy men should no longer receive prostate-specific antigen (PSA) blood tests as part of routine cancer screening. Talk with your health care provider about prostate cancer screening.  Testicular cancer screening is not recommended for adult males who have no symptoms. Screening includes self-exam, a health care provider exam, and other screening tests. Consult with your health care provider about any symptoms you have or any concerns you have about testicular cancer.  Use  sunscreen. Apply sunscreen liberally and repeatedly throughout the day. You should seek shade when your shadow is shorter than you. Protect yourself by wearing long sleeves, pants, a wide-brimmed hat, and sunglasses year round, whenever you are outdoors.  Once a month, do a whole-body skin exam, using a mirror to look at the skin on your back. Tell your health care provider about new moles, moles that have irregular borders, moles that are larger than a pencil eraser, or moles that have changed in shape or color.  Stay current with required vaccines (immunizations).  Influenza vaccine. All adults should be immunized every year.  Tetanus, diphtheria, and acellular pertussis (Td, Tdap) vaccine. An adult who has not previously received Tdap or who does not know his vaccine status should receive 1 dose of Tdap. This initial dose should be followed by tetanus and diphtheria toxoids (Td) booster doses every 10 years. Adults with an unknown or incomplete history of completing a 3-dose immunization series with Td-containing vaccines should begin or complete a primary immunization series including a Tdap dose. Adults should receive a Td booster every 10 years.  Varicella vaccine. An adult without evidence of immunity to varicella should receive 2 doses or a second dose if he has previously received 1 dose.  Human papillomavirus (HPV) vaccine. Males aged 20 21 years who have not received the vaccine previously should receive the 3-dose series. Males aged 60 26 years may be immunized. Immunization is recommended through the age of 53 years for any male who has sex with males and did not get any or all doses earlier. Immunization is recommended for any person with an immunocompromised condition through the age of 42 years if he did not get any or all doses earlier. During the 3-dose series, the second dose should be obtained 4 8 weeks after the first dose. The third dose should be obtained 24 weeks after the first  dose and 16 weeks after the second dose.  Zoster vaccine. One dose is recommended for adults aged 18 years or older unless certain conditions are present.  Measles, mumps, and rubella (MMR) vaccine. Adults born before 35 generally are considered immune to measles and mumps. Adults born in [birth year] or later should have 1 or more doses of MMR vaccine unless there is a contraindication to the vaccine or there is laboratory evidence of immunity to each of the three diseases. A routine second dose of MMR vaccine should be obtained at least 28  days after the first dose for students attending postsecondary schools, health care workers, or international travelers. People who received inactivated measles vaccine or an unknown type of measles vaccine during 1963 1967 should receive 2 doses of MMR vaccine. People who received inactivated mumps vaccine or an unknown type of mumps vaccine before 1979 and are at high risk for mumps infection should consider immunization with 2 doses of MMR vaccine. Unvaccinated health care workers born before [birth year] who lack laboratory evidence of measles, mumps, or rubella immunity or laboratory confirmation of disease should consider measles and mumps immunization with 2 doses of MMR vaccine or rubella immunization with 1 dose of MMR vaccine.  Pneumococcal 13-valent conjugate (PCV13) vaccine. When indicated, a person who is uncertain of his immunization history and has no record of immunization should receive the PCV13 vaccine. An adult aged 28 years or older who has certain medical conditions and has not been previously immunized should receive 1 dose of PCV13 vaccine. This PCV13 should be followed with a dose of pneumococcal polysaccharide (PPSV23) vaccine. The PPSV23 vaccine dose should be obtained at least 8 weeks after the dose of PCV13 vaccine. An adult aged 84 years or older who has certain medical conditions and previously received 1 or more doses of PPSV23 vaccine should receive 1  dose of PCV13. The PCV13 vaccine dose should be obtained 1 or more years after the last PPSV23 vaccine dose.  Pneumococcal polysaccharide (PPSV23) vaccine. When PCV13 is also indicated, PCV13 should be obtained first. All adults aged 65 years and older should be immunized. An adult younger than age 53 years who has certain medical conditions should be immunized. Any person who resides in a nursing home or long-term care facility should be immunized. An adult smoker should be immunized. People with an immunocompromised condition and certain other conditions should receive both PCV13 and PPSV23 vaccines. People with human immunodeficiency virus (HIV) infection should be immunized as soon as possible after diagnosis. Immunization during chemotherapy or radiation therapy should be avoided. Routine use of PPSV23 vaccine is not recommended for American Indians, [place] Natives, or people younger than 65 years unless there are medical conditions that require PPSV23 vaccine. When indicated, people who have unknown immunization and have no record of immunization should receive PPSV23 vaccine. One-time revaccination 5 years after the first dose of PPSV23 is recommended for people aged 12 64 years who have chronic kidney failure, nephrotic syndrome, asplenia, or immunocompromised conditions. People who received 1 2 doses of PPSV23 before age 58 years should receive another dose of PPSV23 vaccine at age 13 years or later if at least 5 years have passed since the previous dose. Doses of PPSV23 are not needed for people immunized with PPSV23 at or after age 14 years.  Meningococcal vaccine. Adults with asplenia or persistent complement component deficiencies should receive 2 doses of quadrivalent meningococcal conjugate (MenACWY-D) vaccine. The doses should be obtained at least 2 months apart. Microbiologists working with certain meningococcal bacteria, [employer] recruits, people at risk during an outbreak, and people who  travel to or live in countries with a high rate of meningitis should be immunized. A first-year college student up through age 79 years who is living in a residence hall should receive a dose if he did not receive a dose on or after his 16th birthday. Adults who have certain high-risk conditions should receive one or more doses of vaccine.  Hepatitis A vaccine. Adults who wish to be protected from this disease, have certain high-risk conditions,  work with hepatitis A-infected animals, work in hepatitis A research labs, or travel to or work in countries with a high rate of hepatitis A should be immunized. Adults who were previously unvaccinated and who anticipate close contact with an international adoptee during the first 60 days after arrival in the [place] from a country with a high rate of hepatitis A should be immunized.  Hepatitis B vaccine. Adults who wish to be protected from this disease, have certain high-risk conditions, may be exposed to blood or other infectious body fluids, are household contacts or sex partners of hepatitis B positive people, are clients or workers in certain care facilities, or travel to or work in countries with a high rate of hepatitis B should be immunized.

## 2013-09-22 ENCOUNTER — Ambulatory Visit (INDEPENDENT_AMBULATORY_CARE_PROVIDER_SITE_OTHER): Payer: Medicare Other | Admitting: Family Medicine

## 2013-09-22 ENCOUNTER — Encounter: Payer: Self-pay | Admitting: Family Medicine

## 2013-09-22 VITALS — BP 125/67 | HR 74 | Temp 98.1°F | Wt 220.6 lb

## 2013-09-22 DIAGNOSIS — I422 Other hypertrophic cardiomyopathy: Secondary | ICD-10-CM

## 2013-09-22 DIAGNOSIS — E785 Hyperlipidemia, unspecified: Secondary | ICD-10-CM

## 2013-09-22 DIAGNOSIS — Z7901 Long term (current) use of anticoagulants: Secondary | ICD-10-CM

## 2013-09-22 DIAGNOSIS — D649 Anemia, unspecified: Secondary | ICD-10-CM

## 2013-09-22 DIAGNOSIS — F319 Bipolar disorder, unspecified: Secondary | ICD-10-CM

## 2013-09-22 DIAGNOSIS — I5030 Unspecified diastolic (congestive) heart failure: Secondary | ICD-10-CM

## 2013-09-22 DIAGNOSIS — F329 Major depressive disorder, single episode, unspecified: Secondary | ICD-10-CM

## 2013-09-22 DIAGNOSIS — I4891 Unspecified atrial fibrillation: Secondary | ICD-10-CM

## 2013-09-22 DIAGNOSIS — F3289 Other specified depressive episodes: Secondary | ICD-10-CM

## 2013-09-22 DIAGNOSIS — N189 Chronic kidney disease, unspecified: Secondary | ICD-10-CM

## 2013-09-22 DIAGNOSIS — F32A Depression, unspecified: Secondary | ICD-10-CM

## 2013-09-22 DIAGNOSIS — Z950 Presence of cardiac pacemaker: Secondary | ICD-10-CM

## 2013-09-22 LAB — POCT INR: INR: 2.3

## 2013-09-22 NOTE — Progress Notes (Signed)
Patient ID: [name], male   DOB: [birth year], 80 y.o.   MRN: [id number] SUBJECTIVE: CC: Chief Complaint  Patient presents with  . Follow-up    needs protime     HPI: Here for follow up of his multiple medical problems .  Patient is here for follow up of hyperlipidemia/diastolic heart failure/bipolar disorder/atrial fibrillation: denies Headache;denies Chest Pain;denies weakness;denies Shortness of Breath and orthopnea;denies Visual changes;denies palpitations;denies cough;denies pedal edema;denies symptoms of TIA or stroke;deniesClaudication symptoms. admits to Compliance with medications; denies Problems with medications.  He has generalized weakness and easily tired when he gets up to walk but he doesn't exercise and he sits and naps all day and then he cannot sleep at nights.  Past Medical History  Diagnosis Date  . Anemia   . Chronic constipation   . Bloating   . Colonic neoplasm   . Occult blood positive stool   . A-fib   . Hypertrophic cardiomyopathy   . Bipolar disorder   . Diastolic heart failure   . Pacemaker     placed and [facility] at [facility]  . Depression   . Chronic kidney disease   . Thrombocytosis   . CKD (chronic kidney disease) stage 3, GFR 30-59 ml/min 03/16/2013   Past Surgical History  Procedure Laterality Date  . Colonoscopy  10/02/2010    W[facility]  . Fracture surgery    . Pacemaker insertion      [facility]  . Cholecystectomy     History   Social History  . Marital Status: Widowed    Spouse Name: N/A    Number of Children: N/A  . Years of Education: N/A   Occupational History  . Not on file.   Social History Main Topics  . Smoking status: Former Smoker    Quit date: 10/05/1972  . Smokeless tobacco: Never Used  . Alcohol Use: No  . Drug Use: No  . Sexual Activity: No   Other Topics Concern  . Not on file   Social History Narrative  . No narrative on file   Family History  Problem Relation Age of Onset  . Heart disease Mother    . Heart disease Sister   . Heart disease Brother    Current Outpatient Prescriptions on File Prior to Visit  Medication Sig Dispense Refill  . aspirin EC 81 MG EC tablet Take 1 tablet (81 mg total) by mouth daily.      Marland Kitchen atorvastatin (LIPITOR) 40 MG tablet Take 1 tablet (40 mg total) by mouth daily at 6 PM.  30 tablet  5  . fenofibrate 160 MG tablet Take 1 tablet (160 mg total) by mouth daily.  30 tablet  5  . furosemide (LASIX) 20 MG tablet Take 20-40 mg by mouth 2 (two) times daily. Take two tablets by mouth daily in the morning and one tablet in the evening      . gabapentin (NEURONTIN) 100 MG capsule Take 100 mg by mouth 2 (two) times daily.       . hydroxyurea (HYDREA) 500 MG capsule Take 500 mg by mouth every other day. May take with food to minimize GI side effects.      Marland Kitchen loratadine (CLARITIN) 10 MG tablet Take 10 mg by mouth daily.      . metoprolol tartrate (LOPRESSOR) 25 MG tablet Take 25 mg by mouth 2 (two) times daily.      . Multiple Vitamins-Minerals (CERTA-VITE SENIOR-LUTEIN PO) Take 1 tablet by mouth every morning.      Marland Kitchen  nitroGLYCERIN (NITRODUR - DOSED IN MG/24 HR) 0.2 mg/hr Place 1 patch onto the skin daily.      Marland Kitchen omeprazole (PRILOSEC) 20 MG capsule Take 20 mg by mouth daily.        . polyethylene glycol powder (MIRALAX) powder Take 17 g by mouth daily as needed.      . potassium chloride (KLOR-CON) 20 MEQ packet Take 20 mEq by mouth daily.       . risperiDONE (RISPERDAL) 1 MG tablet Take 1 mg by mouth 2 (two) times daily.      Marland Kitchen senna (SENOKOT) 8.6 MG TABS Take 2 tablets by mouth.      . Tamsulosin HCl (FLOMAX) 0.4 MG CAPS Take 0.4 mg by mouth every evening.       . temazepam (RESTORIL) 7.5 MG capsule Take 1 capsule (7.5 mg total) by mouth every evening. 6pm  30 capsule  1  . vitamin B-12 (CYANOCOBALAMIN) 1000 MCG tablet Take 1,000 mcg by mouth daily.        Marland Kitchen warfarin (COUMADIN) 2 MG tablet Take $RemoveBef'2mg'qIgjKQKXnX$  daily except on Wednesdays and Saturdays take $RemoveBefore'4mg'AiAwZOwRtOtGQ$ .  **Corrected  Dosage**  40 tablet  0   No current facility-administered medications on file prior to visit.   Allergies  Allergen Reactions  . Abilify [Aripiprazole]     Other reaction(s): Other (See Comments) Hallucinations  . Demerol   . Meperidine     Other reaction(s): Mental Status Changes (intolerance)  . Morphine Other (See Comments)    Hallucinations  . Morphine And Related   . Risperdal [Risperidone] Other (See Comments)    Weakness, tiredness   Immunization History  Administered Date(s) Administered  . Influenza,inj,Quad PF,36+ Mos 02/28/2013   Prior to Admission medications   Medication Sig Start Date End Date Taking? Authorizing Provider  aspirin EC 81 MG EC tablet Take 1 tablet (81 mg total) by mouth daily. 03/15/13   Brittainy Simmons, PA-C  atorvastatin (LIPITOR) 40 MG tablet Take 1 tablet (40 mg total) by mouth daily at 6 PM. 03/15/13   Brittainy Simmons, PA-C  fenofibrate 160 MG tablet Take 1 tablet (160 mg total) by mouth daily. 01/06/13   Vernie Shanks, MD  furosemide (LASIX) 20 MG tablet Take 20-40 mg by mouth 2 (two) times daily. Take two tablets by mouth daily in the morning and one tablet in the evening    Historical Provider, MD  gabapentin (NEURONTIN) 100 MG capsule Take 100 mg by mouth 2 (two) times daily.     Historical Provider, MD  hydroxyurea (HYDREA) 500 MG capsule Take 500 mg by mouth every other day. May take with food to minimize GI side effects.    Historical Provider, MD  loratadine (CLARITIN) 10 MG tablet Take 10 mg by mouth daily.    Historical Provider, MD  metoprolol tartrate (LOPRESSOR) 25 MG tablet Take 25 mg by mouth 2 (two) times daily.    Historical Provider, MD  Multiple Vitamins-Minerals (CERTA-VITE SENIOR-LUTEIN PO) Take 1 tablet by mouth every morning.    Historical Provider, MD  nitroGLYCERIN (NITRODUR - DOSED IN MG/24 HR) 0.2 mg/hr Place 1 patch onto the skin daily.    Historical Provider, MD  omeprazole (PRILOSEC) 20 MG capsule Take 20 mg by mouth  daily.      Historical Provider, MD  polyethylene glycol powder (MIRALAX) powder Take 17 g by mouth daily as needed.    Historical Provider, MD  potassium chloride (KLOR-CON) 20 MEQ packet Take 20 mEq by mouth daily.  Historical Provider, MD  risperiDONE (RISPERDAL) 1 MG tablet Take 1 mg by mouth 2 (two) times daily.    Historical Provider, MD  senna (SENOKOT) 8.6 MG TABS Take 2 tablets by mouth.    Historical Provider, MD  Tamsulosin HCl (FLOMAX) 0.4 MG CAPS Take 0.4 mg by mouth every evening.     Historical Provider, MD  temazepam (RESTORIL) 7.5 MG capsule Take 1 capsule (7.5 mg total) by mouth every evening. 6pm 06/21/13   Vernie Shanks, MD  vitamin B-12 (CYANOCOBALAMIN) 1000 MCG tablet Take 1,000 mcg by mouth daily.      Historical Provider, MD  warfarin (COUMADIN) 2 MG tablet Take $RemoveBef'2mg'mTwDGMtNfr$  daily except on Wednesdays and Saturdays take $RemoveBefore'4mg'nAivDdqzQKFHy$ .  **Corrected Dosage** 02/28/13   [name] Eckard, PHARMD     ROS: As above in the HPI. All other systems are stable or negative.  OBJECTIVE: APPEARANCE:  Patient in no acute distress.The patient appeared well nourished and normally developed. Acyanotic. Waist: VITAL SIGNS:BP 125/67  Pulse 74  Temp(Src) 98.1 F (36.7 C) (Oral)  Wt 220 lb 9.6 oz (100.064 kg) Obes eWM . Flat facies. Ambulates with a walker  SKIN: warm and  Dry without overt rashes, tattoos and scars  HEAD and Neck: without JVD, Head and scalp: normal Eyes:No scleral icterus. Fundi normal, eye movements normal. Ears: Auricle normal, canal normal, Tympanic membranes normal, insufflation normal. Nose: normal Throat: normal Neck & thyroid: normal  CHEST & LUNGS: Chest wall: normal Lungs: Clear  CVS: Reveals the PMI to be normally located. Regular rhythm, First and Second Heart sounds are normal,  1/6 soft systolic murmurs,no rubs or gallops. Peripheral vasculature: Radial pulses: normal  ABDOMEN:  Appearance: obese Benign, no organomegaly, no masses, no Abdominal  Aortic enlargement. No Guarding , no rebound. No Bruits. Bowel sounds: normal  RECTAL: N/A GU: N/A  EXTREMETIES: nonedematous.  MUSCULOSKELETAL:  Spine: normal Generalized weakness  NEUROLOGIC: oriented to place and person; nonfocal.  ASSESSMENT: Atrial fibrillation - Plan: POCT INR, TSH  Long term (current) use of anticoagulants  Hypertrophic cardiomyopathy  Diastolic heart failure - Plan: Brain natriuretic peptide, CMP14+EGFR  Depression  CKD (chronic kidney disease) - Plan: CMP14+EGFR  Cardiac pacemaker in situ  Bipolar disorder, unspecified  Anemia  HLD (hyperlipidemia) - Plan: Lipid panel  PLAN:  Orders Placed This Encounter  Procedures  . Brain natriuretic peptide  . CMP14+EGFR  . Lipid panel  . TSH   Results for orders placed in visit on 09/22/13  POCT INR      Result Value Ref Range   INR 2.3     Recommended for patient to participate in activities and staff to reduce his  Sleeping during the day time. Continue same  Regimen. Continue same nursing home care.  No orders of the defined types were placed in this encounter.   There are no discontinued medications. Return in about 4 weeks (around 10/20/2013) for recheck protime.  Lashunta Frieden P. Jacelyn Grip, M.D.

## 2013-09-26 ENCOUNTER — Telehealth: Payer: Self-pay | Admitting: *Deleted

## 2013-09-26 ENCOUNTER — Other Ambulatory Visit: Payer: Self-pay | Admitting: *Deleted

## 2013-09-26 LAB — CMP14+EGFR
ALT: 13 IU/L (ref 0–44)
AST: 21 IU/L (ref 0–40)
Albumin/Globulin Ratio: 2 (ref 1.1–2.5)
Albumin: 4.3 g/dL (ref 3.5–4.7)
Alkaline Phosphatase: 38 IU/L — ABNORMAL LOW (ref 39–117)
BUN/Creatinine Ratio: 12 (ref 10–22)
BUN: 21 mg/dL (ref 8–27)
CO2: 25 mmol/L (ref 18–29)
Calcium: 9.1 mg/dL (ref 8.6–10.2)
Chloride: 105 mmol/L (ref 97–108)
Creatinine, Ser: 1.81 mg/dL — ABNORMAL HIGH (ref 0.76–1.27)
GFR calc Af Amer: 38 mL/min/{1.73_m2} — ABNORMAL LOW (ref >59)
GFR calc non Af Amer: 33 mL/min/{1.73_m2} — ABNORMAL LOW (ref >59)
Globulin, Total: 2.2 g/dL (ref 1.5–4.5)
Glucose: 102 mg/dL — ABNORMAL HIGH (ref 65–99)
Potassium: 4.8 mmol/L (ref 3.5–5.2)
Sodium: 146 mmol/L — ABNORMAL HIGH (ref 134–144)
Total Bilirubin: 0.5 mg/dL (ref 0.0–1.2)
Total Protein: 6.5 g/dL (ref 6.0–8.5)

## 2013-09-26 LAB — BRAIN NATRIURETIC PEPTIDE: BNP: 247.7 pg/mL — ABNORMAL HIGH (ref 0.0–100.0)

## 2013-09-26 LAB — LIPID PANEL
Chol/HDL Ratio: 3.9 ratio units (ref 0.0–5.0)
Cholesterol, Total: 116 mg/dL (ref 100–199)
HDL: 30 mg/dL — ABNORMAL LOW (ref >39)
LDL Calculated: 63 mg/dL (ref 0–99)
Triglycerides: 117 mg/dL (ref 0–149)
VLDL Cholesterol Cal: 23 mg/dL (ref 5–40)

## 2013-09-26 LAB — TSH: TSH: 3.17 u[IU]/mL (ref 0.450–4.500)

## 2013-09-26 MED ORDER — TEMAZEPAM 7.5 MG PO CAPS: 7.5000 mg | ORAL_CAPSULE | Freq: Every evening | ORAL | Status: DC

## 2013-09-26 NOTE — Telephone Encounter (Signed)
Rx ready for nurse to Phone in. 

## 2013-09-26 NOTE — Telephone Encounter (Signed)
Called in.

## 2013-09-26 NOTE — Telephone Encounter (Signed)
Sherry at [facility] given results to relay to patient.

## 2013-09-26 NOTE — Telephone Encounter (Signed)
Message copied by Shelbie Ammons on Mon Sep 26, 2013  4:04 PM ------      Message from: Vernie Shanks      Created: Mon Sep 26, 2013  1:49 PM       Call Patient      Lab result at or close to goal.      No change in Medications for now.      No Change in recommendations.      No change in plans for follow up. ------

## 2013-09-26 NOTE — Telephone Encounter (Signed)
Last ov 09/22/13. If approved call to [place]. Route to [employer]. Thanks.

## 2013-10-10 ENCOUNTER — Other Ambulatory Visit: Payer: Self-pay | Admitting: Family Medicine

## 2013-10-10 ENCOUNTER — Telehealth: Payer: Self-pay | Admitting: *Deleted

## 2013-10-10 MED ORDER — ZOLPIDEM TARTRATE 5 MG PO TABS: 5.0000 mg | ORAL_TABLET | Freq: Every evening | ORAL | Status: DC | PRN

## 2013-10-10 NOTE — Telephone Encounter (Signed)
Dr. Jacelyn Grip ins co denied temazepam 7.5 mg saying she must try either lorazepam, alprazolam or zolpidem which are on her formulary. Would either of these work?

## 2013-10-10 NOTE — Telephone Encounter (Signed)
Rx ready for nurse to Phone in. Rx was changed from temazepam to zolpidem.

## 2013-10-10 NOTE — Telephone Encounter (Signed)
[place] notified to have patient finish Temazepam 7.5 mg and then start Ambien 5mg  each night as needed for sleep.  Orders by Dr. Jacelyn Grip. Insurance will not pay for Temazepam.  Ambien was called to [employer].

## 2013-10-20 ENCOUNTER — Ambulatory Visit (INDEPENDENT_AMBULATORY_CARE_PROVIDER_SITE_OTHER): Payer: Medicare Other | Admitting: Pharmacist

## 2013-10-20 DIAGNOSIS — I4891 Unspecified atrial fibrillation: Secondary | ICD-10-CM

## 2013-10-20 DIAGNOSIS — Z7901 Long term (current) use of anticoagulants: Secondary | ICD-10-CM

## 2013-10-20 LAB — POCT INR: INR: 2.1

## 2013-10-20 NOTE — Patient Instructions (Signed)
Anticoagulation Dose Instructions as of 10/20/2013     [name] Tue Wed Thu Fri Sat   New Dose 2 mg 2 mg 2 mg 2 mg 2 mg 2 mg 4 mg    Description       Continue 2mg  daily except 4mg  on Saturdays.      INR was 2.1 today

## 2013-11-24 ENCOUNTER — Telehealth: Payer: Self-pay | Admitting: Family Medicine

## 2013-11-24 ENCOUNTER — Ambulatory Visit (INDEPENDENT_AMBULATORY_CARE_PROVIDER_SITE_OTHER): Payer: Medicare Other | Admitting: Pharmacist

## 2013-11-24 DIAGNOSIS — I4891 Unspecified atrial fibrillation: Secondary | ICD-10-CM

## 2013-11-24 DIAGNOSIS — Z7901 Long term (current) use of anticoagulants: Secondary | ICD-10-CM

## 2013-11-24 LAB — POCT INR: INR: 2.8

## 2013-11-24 NOTE — Patient Instructions (Signed)
Anticoagulation Dose Instructions as of 11/24/2013     Dorene Grebe Tue Wed Thu Fri Sat   New Dose 2 mg 2 mg 2 mg 2 mg 2 mg 2 mg 4 mg    Description       Continue current warfarin dose of 2mg  daily except 4mg  on Saturdays.      INR was 2.8 today

## 2013-11-29 ENCOUNTER — Telehealth: Payer: Self-pay | Admitting: Family Medicine

## 2013-11-29 NOTE — Telephone Encounter (Signed)
Spoke with daughter and [name] is requesting to be moved to [facility] from [facility]. Patients daughter will call and talk with Barnett Applebaum tomorrow and have her help with the process.

## 2013-11-30 ENCOUNTER — Encounter: Payer: Self-pay | Admitting: *Deleted

## 2013-12-03 ENCOUNTER — Emergency Department (HOSPITAL_COMMUNITY)
Admission: EM | Admit: 2013-12-03 | Discharge: 2013-12-03 | Disposition: A | Discharge status: Home / Self Care | Payer: Medicare Other | Attending: Emergency Medicine | Admitting: Emergency Medicine

## 2013-12-03 ENCOUNTER — Emergency Department (HOSPITAL_COMMUNITY): Payer: Medicare Other

## 2013-12-03 ENCOUNTER — Encounter (HOSPITAL_COMMUNITY): Payer: Self-pay | Admitting: Emergency Medicine

## 2013-12-03 DIAGNOSIS — R0602 Shortness of breath: Secondary | ICD-10-CM | POA: Diagnosis not present

## 2013-12-03 DIAGNOSIS — D649 Anemia, unspecified: Secondary | ICD-10-CM

## 2013-12-03 DIAGNOSIS — I5030 Unspecified diastolic (congestive) heart failure: Secondary | ICD-10-CM | POA: Insufficient documentation

## 2013-12-03 DIAGNOSIS — R112 Nausea with vomiting, unspecified: Secondary | ICD-10-CM | POA: Insufficient documentation

## 2013-12-03 DIAGNOSIS — L03319 Cellulitis of trunk, unspecified: Principal | ICD-10-CM

## 2013-12-03 DIAGNOSIS — N183 Chronic kidney disease, stage 3 unspecified: Secondary | ICD-10-CM | POA: Diagnosis not present

## 2013-12-03 DIAGNOSIS — W1809XA Striking against other object with subsequent fall, initial encounter: Secondary | ICD-10-CM | POA: Diagnosis not present

## 2013-12-03 DIAGNOSIS — R42 Dizziness and giddiness: Secondary | ICD-10-CM | POA: Diagnosis not present

## 2013-12-03 DIAGNOSIS — S0990XA Unspecified injury of head, initial encounter: Secondary | ICD-10-CM | POA: Diagnosis not present

## 2013-12-03 DIAGNOSIS — Z7982 Long term (current) use of aspirin: Secondary | ICD-10-CM | POA: Insufficient documentation

## 2013-12-03 DIAGNOSIS — Y929 Unspecified place or not applicable: Secondary | ICD-10-CM | POA: Insufficient documentation

## 2013-12-03 DIAGNOSIS — Z85038 Personal history of other malignant neoplasm of large intestine: Secondary | ICD-10-CM | POA: Insufficient documentation

## 2013-12-03 DIAGNOSIS — W19XXXA Unspecified fall, initial encounter: Secondary | ICD-10-CM

## 2013-12-03 DIAGNOSIS — F329 Major depressive disorder, single episode, unspecified: Secondary | ICD-10-CM | POA: Insufficient documentation

## 2013-12-03 DIAGNOSIS — Z87891 Personal history of nicotine dependence: Secondary | ICD-10-CM | POA: Diagnosis not present

## 2013-12-03 DIAGNOSIS — K5900 Constipation, unspecified: Secondary | ICD-10-CM | POA: Diagnosis not present

## 2013-12-03 DIAGNOSIS — Y9301 Activity, walking, marching and hiking: Secondary | ICD-10-CM | POA: Insufficient documentation

## 2013-12-03 DIAGNOSIS — I4891 Unspecified atrial fibrillation: Secondary | ICD-10-CM | POA: Insufficient documentation

## 2013-12-03 DIAGNOSIS — Z79899 Other long term (current) drug therapy: Secondary | ICD-10-CM | POA: Diagnosis not present

## 2013-12-03 DIAGNOSIS — Z95810 Presence of automatic (implantable) cardiac defibrillator: Secondary | ICD-10-CM | POA: Diagnosis not present

## 2013-12-03 DIAGNOSIS — Z7901 Long term (current) use of anticoagulants: Secondary | ICD-10-CM | POA: Insufficient documentation

## 2013-12-03 DIAGNOSIS — F3289 Other specified depressive episodes: Secondary | ICD-10-CM | POA: Diagnosis not present

## 2013-12-03 DIAGNOSIS — L02219 Cutaneous abscess of trunk, unspecified: Secondary | ICD-10-CM | POA: Insufficient documentation

## 2013-12-03 DIAGNOSIS — L0291 Cutaneous abscess, unspecified: Secondary | ICD-10-CM

## 2013-12-03 LAB — CBC WITH DIFFERENTIAL/PLATELET
Basophils Absolute: 0.3 10*3/uL — ABNORMAL HIGH (ref 0.0–0.1)
Basophils Relative: 4 % — ABNORMAL HIGH (ref 0–1)
EOS ABS: 0.2 10*3/uL (ref 0.0–0.7)
Eosinophils Relative: 2 % (ref 0–5)
HCT: 27.9 % — ABNORMAL LOW (ref 39.0–52.0)
HEMOGLOBIN: 8.6 g/dL — AB (ref 13.0–17.0)
Lymphocytes Relative: 15 % (ref 12–46)
Lymphs Abs: 1.1 10*3/uL (ref 0.7–4.0)
MCH: 28.5 pg (ref 26.0–34.0)
MCHC: 30.8 g/dL (ref 30.0–36.0)
MCV: 92.4 fL (ref 78.0–100.0)
MONOS PCT: 12 % (ref 3–12)
Monocytes Absolute: 0.8 10*3/uL (ref 0.1–1.0)
NEUTROS ABS: 4.9 10*3/uL (ref 1.7–7.7)
Neutrophils Relative %: 67 % (ref 43–77)
Platelets: 456 10*3/uL — ABNORMAL HIGH (ref 150–400)
RBC: 3.02 MIL/uL — ABNORMAL LOW (ref 4.22–5.81)
RDW: 15.4 % (ref 11.5–15.5)
WBC: 7.2 10*3/uL (ref 4.0–10.5)

## 2013-12-03 LAB — COMPREHENSIVE METABOLIC PANEL
ALK PHOS: 34 U/L — AB (ref 39–117)
ALT: 13 U/L (ref 0–53)
AST: 24 U/L (ref 0–37)
Albumin: 3.5 g/dL (ref 3.5–5.2)
BILIRUBIN TOTAL: 0.6 mg/dL (ref 0.3–1.2)
BUN: 20 mg/dL (ref 6–23)
CO2: 29 mEq/L (ref 19–32)
Calcium: 8.9 mg/dL (ref 8.4–10.5)
Chloride: 105 mEq/L (ref 96–112)
Creatinine, Ser: 1.67 mg/dL — ABNORMAL HIGH (ref 0.50–1.35)
GFR calc non Af Amer: 35 mL/min — ABNORMAL LOW (ref >90)
GFR, EST AFRICAN AMERICAN: 41 mL/min — AB (ref >90)
GLUCOSE: 110 mg/dL — AB (ref 70–99)
POTASSIUM: 3.7 meq/L (ref 3.7–5.3)
Sodium: 144 mEq/L (ref 137–147)
Total Protein: 6.5 g/dL (ref 6.0–8.3)

## 2013-12-03 LAB — URINALYSIS, ROUTINE W REFLEX MICROSCOPIC
BILIRUBIN URINE: NEGATIVE
Glucose, UA: NEGATIVE mg/dL
Hgb urine dipstick: NEGATIVE
Ketones, ur: NEGATIVE mg/dL
LEUKOCYTES UA: NEGATIVE
NITRITE: NEGATIVE
PH: 7 (ref 5.0–8.0)
PROTEIN: NEGATIVE mg/dL
Specific Gravity, Urine: 1.015 (ref 1.005–1.030)
Urobilinogen, UA: 0.2 mg/dL (ref 0.0–1.0)

## 2013-12-03 LAB — POC OCCULT BLOOD, ED: FECAL OCCULT BLD: NEGATIVE

## 2013-12-03 LAB — I-STAT CG4 LACTIC ACID, ED: Lactic Acid, Venous: 0.69 mmol/L (ref 0.5–2.2)

## 2013-12-03 LAB — TROPONIN I: Troponin I: <0.3 ng/mL (ref <0.30)

## 2013-12-03 MED ORDER — LIDOCAINE HCL (PF) 1 % IJ SOLN
5.0000 mL | Freq: Once | INTRAMUSCULAR | Status: AC
Start: 2013-12-03 — End: 2013-12-03
  Administered 2013-12-03: 5 mL via INTRADERMAL
  Filled 2013-12-03: qty 5

## 2013-12-03 MED ORDER — SODIUM CHLORIDE 0.9 % IV BOLUS (SEPSIS)
1000.0000 mL | Freq: Once | INTRAVENOUS | Status: AC
  Administered 2013-12-03: 1000 mL via INTRAVENOUS

## 2013-12-03 NOTE — ED Notes (Signed)
Patient with no complaints at this time. Respirations even and unlabored. Skin warm/dry. Discharge instructions reviewed with patient at this time. Patient given opportunity to voice concerns/ask questions. IV removed per policy and band-aid applied to site. Patient discharged at this time and left Emergency Department via wheelchair.  

## 2013-12-03 NOTE — ED Provider Notes (Signed)
CSN: [id number]     Arrival date & time 12/03/13  1238 History   This chart was scribed for Carmin Muskrat, MD by Martinique Peace, ED Scribe. The patient was seen in APA04/APA04. The patient's care was started at 1:22 PM.    Chief Complaint  Patient presents with  . Fall  . Abscess      Patient is a 80 y.o. male presenting with fall and abscess. The history is provided by the patient. No language interpreter was used.  Fall Associated symptoms include shortness of breath. Pertinent negatives include no chest pain, no abdominal pain and no headaches.  Abscess Associated symptoms: nausea and vomiting   Associated symptoms: no headaches    HPI Comments: [name] is a 80 y.o. male who presents to the Emergency Department complaining off fall onset two hrs ago that occurred when he was walking and lost his balance causing him to fall backwards and hit his head but denies LOC at any point. He states that he has history of losing balance and falling in the past. He also complains of constant right knee pain that tends to give out on him sometimes. Pt also has abscess to mid thoracic region of his back. He states experiencing SOB, nasuea, vomiting, and weakness. No headaches, chest pain, abdominal pain or back pain.    Past Medical History  Diagnosis Date  . Anemia   . Chronic constipation   . Bloating   . Colonic neoplasm   . Occult blood positive stool   . A-fib   . Hypertrophic cardiomyopathy   . Bipolar disorder   . Diastolic heart failure   . Pacemaker     placed and followed at [facility]  . Depression   . Chronic kidney disease   . Thrombocytosis   . CKD (chronic kidney disease) stage 3, GFR 30-59 ml/min 03/16/2013   Past Surgical History  Procedure Laterality Date  . Colonoscopy  10/02/2010    W/POLYP  . Fracture surgery    . Pacemaker insertion      [facility]  . Cholecystectomy     Family History  Problem Relation Age of Onset  . Heart disease Mother   . Heart  disease Sister   . Heart disease Brother    History  Substance Use Topics  . Smoking status: Former Smoker    Quit date: 10/05/1972  . Smokeless tobacco: Never Used  . Alcohol Use: No    Review of Systems  Respiratory: Positive for shortness of breath.   Cardiovascular: Negative for chest pain.  Gastrointestinal: Positive for nausea and vomiting. Negative for abdominal pain.  Musculoskeletal: Negative for back pain.  Neurological: Positive for dizziness and weakness. Negative for headaches.      Allergies  Abilify; Demerol; Meperidine; Morphine; Morphine and related; and Risperdal  Home Medications   Prior to Admission medications   Medication Sig Start Date End Date Taking? Authorizing Provider  acetaminophen (TYLENOL) 500 MG tablet Take 1,000 mg by mouth every 6 (six) hours as needed for mild pain or headache.   Yes Historical Provider, MD  aspirin EC 81 MG EC tablet Take 1 tablet (81 mg total) by mouth daily. 03/15/13  Yes Brittainy Simmons, PA-C  atorvastatin (LIPITOR) 40 MG tablet Take 1 tablet (40 mg total) by mouth daily at 6 PM. 03/15/13  Yes Brittainy Rosita Fire, PA-C  fenofibrate 160 MG tablet Take 1 tablet (160 mg total) by mouth daily. 01/06/13  Yes Vernie Shanks, MD  furosemide (LASIX) 20 MG  tablet Take 20-40 mg by mouth 2 (two) times daily. Take two tablets by mouth daily in the morning and one tablet in the evening   Yes Historical Provider, MD  gabapentin (NEURONTIN) 100 MG capsule Take 100 mg by mouth 2 (two) times daily.    Yes Historical Provider, MD  guaiFENesin (MUCINEX) 600 MG 12 hr tablet Take 600 mg by mouth 2 (two) times daily as needed (congestion).   Yes Historical Provider, MD  guaifenesin (ROBITUSSIN) 100 MG/5ML syrup Take 200 mg by mouth every 6 (six) hours as needed for cough.   Yes Historical Provider, MD  hydroxyurea (HYDREA) 500 MG capsule Take 500 mg by mouth every other day. May take with food to minimize GI side effects.   Yes Historical Provider,  MD  loperamide (IMODIUM) 2 MG capsule Take 4 mg by mouth as needed for diarrhea or loose stools. 2 capsules as initial dose, then 1 capsule after each loose bowel movement.   Yes Historical Provider, MD  loratadine (CLARITIN) 10 MG tablet Take 10 mg by mouth daily.   Yes Historical Provider, MD  metoprolol tartrate (LOPRESSOR) 25 MG tablet Take 25 mg by mouth 2 (two) times daily.   Yes Historical Provider, MD  Multiple Vitamins-Minerals (CERTA-VITE SENIOR-LUTEIN PO) Take 1 tablet by mouth every morning.   Yes Historical Provider, MD  nitroGLYCERIN (NITRODUR - DOSED IN MG/24 HR) 0.2 mg/hr Place 1 patch onto the skin daily.   Yes Historical Provider, MD  omeprazole (PRILOSEC) 20 MG capsule Take 20 mg by mouth daily.     Yes Historical Provider, MD  potassium chloride (KLOR-CON) 20 MEQ packet Take 20 mEq by mouth daily.    Yes Historical Provider, MD  risperiDONE (RISPERDAL) 1 MG tablet Take 1 mg by mouth 2 (two) times daily.   Yes Historical Provider, MD  senna (SENOKOT) 8.6 MG TABS Take 2 tablets by mouth at bedtime.    Yes Historical Provider, MD  vitamin B-12 (CYANOCOBALAMIN) 1000 MCG tablet Take 1,000 mcg by mouth daily.     Yes Historical Provider, MD  warfarin (COUMADIN) 2 MG tablet Take 2mg  daily except on Wednesdays and Saturdays take 4mg .  **Corrected Dosage** 02/28/13  Yes [name] Eckard, PHARMD  zolpidem (AMBIEN) 5 MG tablet Take 5 mg by mouth at bedtime.   Yes Historical Provider, MD   Triage Vitals: BP 119/63  Pulse 75  Temp(Src) 98.5 F (36.9 C) (Rectal)  Resp 22  Ht 6\' 3"  (1.905 m)  Wt 220 lb (99.791 kg)  BMI 27.50 kg/m2  SpO2 99% Physical Exam  Nursing note and vitals reviewed. Constitutional: He is oriented to person, place, and time. He appears well-developed and well-nourished. No distress.  HENT:  Head: Normocephalic and atraumatic.  Eyes: Conjunctivae and EOM are normal.  Neck: Neck supple. No tracheal deviation present.  Slightly restricted ROM  Cardiovascular:  Normal rate.   Pulmonary/Chest: Effort normal. No respiratory distress.  Abdominal: Soft. There is no rebound and no guarding.  Suprapubic pain.  Musculoskeletal: Normal range of motion.  Neurological: He is alert and oriented to person, place, and time.  Skin: Skin is warm and dry. No erythema.  Mid thoracic area abscess actively draining with blood.  3 cm across.   Psychiatric: He has a normal mood and affect. His behavior is normal.    ED Course  Procedures (including critical care time) DIAGNOSTIC STUDIES: Oxygen Saturation is 99% on room air, normal by my interpretation.    COORDINATION OF CARE: 1:26 PM- Treatment  plan was discussed with patient who verbalizes understanding and agrees.   INCISION AND DRAINAGE Performed by: Carmin Muskrat Consent: Verbal consent obtained. Risks and benefits: risks, benefits and alternatives were discussed Type: abscess  Body area: back - mid thoracic   Anesthesia: local infiltration  Incision was made with a scalpel.  Local anesthetic: lidocaine 1% w/o epinephrine  Anesthetic total: 3 ml  Incision made w #10 scalpel  Drainage: purulent  Drainage amount: significant ~39ml purulent / semisolid with blood   Patient tolerance: Patient tolerated the procedure well with no immediate complications.     Labs Review Labs Reviewed  CBC WITH DIFFERENTIAL - Abnormal; Notable for the following:    RBC 3.02 (*)    Hemoglobin 8.6 (*)    HCT 27.9 (*)    Platelets 456 (*)    Basophils Relative 4 (*)    Basophils Absolute 0.3 (*)    All other components within normal limits  COMPREHENSIVE METABOLIC PANEL - Abnormal; Notable for the following:    Glucose, Bld 110 (*)    Creatinine, Ser 1.67 (*)    Alkaline Phosphatase 34 (*)    GFR calc non Af Amer 35 (*)    GFR calc Af Amer 41 (*)    All other components within normal limits  URINALYSIS, ROUTINE W REFLEX MICROSCOPIC  TROPONIN I  I-STAT CG4 LACTIC ACID, ED  POC OCCULT BLOOD, ED     Imaging Review Ct Head Wo Contrast  12/03/2013   CLINICAL DATA:  Fall  EXAM: CT HEAD WITHOUT CONTRAST  CT CERVICAL SPINE WITHOUT CONTRAST  TECHNIQUE: Multidetector CT imaging of the head and cervical spine was performed following the standard protocol without intravenous contrast. Multiplanar CT image reconstructions of the cervical spine were also generated.  COMPARISON:  10/31/2011  FINDINGS: CT HEAD FINDINGS  Mild chronic ischemic changes in the left caudate head. Mild global atrophy appropriate to age. No mass effect, midline shift, or acute intracranial hemorrhage. Nasal septum is deviated to the left. Intact cranium.  CT CERVICAL SPINE FINDINGS  Motion artifact degrades the study. No obvious fracture or dislocation. Advanced degenerative changes throughout the cervical spine are noted.  IMPRESSION: No acute intracranial pathology.  No acute bony injury in the cervical spine.   Electronically Signed   By: Maryclare Bean M.D.   On: 12/03/2013 14:56   Ct Cervical Spine Wo Contrast  12/03/2013   CLINICAL DATA:  Fall  EXAM: CT HEAD WITHOUT CONTRAST  CT CERVICAL SPINE WITHOUT CONTRAST  TECHNIQUE: Multidetector CT imaging of the head and cervical spine was performed following the standard protocol without intravenous contrast. Multiplanar CT image reconstructions of the cervical spine were also generated.  COMPARISON:  10/31/2011  FINDINGS: CT HEAD FINDINGS  Mild chronic ischemic changes in the left caudate head. Mild global atrophy appropriate to age. No mass effect, midline shift, or acute intracranial hemorrhage. Nasal septum is deviated to the left. Intact cranium.  CT CERVICAL SPINE FINDINGS  Motion artifact degrades the study. No obvious fracture or dislocation. Advanced degenerative changes throughout the cervical spine are noted.  IMPRESSION: No acute intracranial pathology.  No acute bony injury in the cervical spine.   Electronically Signed   By: Maryclare Bean M.D.   On: 12/03/2013 14:56     EKG  Interpretation   Date/Time:  Saturday December 03 2013 12:48:28 EDT Ventricular Rate:  75 PR Interval:    QRS Duration: 162 QT Interval:  496 QTC Calculation: 554 R Axis:   -70 Text  Interpretation:  Accelerated junctional rhythm IVCD, consider  atypical RBBB Inferior infarct, acute Anterior infarct, old Lateral leads  are also involved VENTRICULAR PACED RHYTHM Confirmed by ZACKOWSKI  MD,  SCOTT [phone]) on 12/03/2013 1:04:29 PM     Medications  sodium chloride 0.9 % bolus 1,000 mL (1,000 mLs Intravenous New Bag/Given 12/03/13 1409)  lidocaine (PF) (XYLOCAINE) 1 % injection 5 mL (not administered)   3:47 PM Patient's daughter is here.  She adds that the patient has a long history of right knee instability and has been evaluated by orthopedics multiple times, and is not felt to be candidate for any additional intervention. She also states that the patient is generally weak, and is transitioning to nursing care from his current status of assisted living.   MDM  This patient with multiple chronic diseases presents after a fall.  On exam patient is awake alert, hemodynamically stable, appropriate interactive. Patient's evaluation is largely reassuring, though there is evidence for persistent anemia, though no evidence of GI bleed.  Patient's radiographic findings are unremarkable. Patient was discharged in stable condition to his nursing facility with instructions to have wound check in 2 days for his drained abscess.  I personally performed the services described in this documentation, which was scribed in my presence. The recorded information has been reviewed and is accurate.      Carmin Muskrat, MD 12/03/13 [phone]

## 2013-12-03 NOTE — Discharge Instructions (Signed)
As discussed, it is very important that you followup with your physician in 2 days, both for discussion of your ongoing falls and anemia, as well as for repeat evaluation of your abscess.  Return here for any concerning changes in your condition, should occur prior to that evaluation.

## 2013-12-03 NOTE — ED Notes (Addendum)
Patient resident of [facility], c/o fall today. Staff states right leg gave out and that he has been favoring that leg and dragging it more than usual. Patient alert to person and place. Disoriented to place, time. States he just lost balance when walking. Patient hot to touch. Abscess noted to back, red, indurated and partially draining. C/o dizziness and shortness of breath as well.

## 2014-01-04 ENCOUNTER — Other Ambulatory Visit: Payer: Self-pay | Admitting: Pharmacist

## 2014-01-04 DIAGNOSIS — Z7901 Long term (current) use of anticoagulants: Secondary | ICD-10-CM

## 2014-01-04 DIAGNOSIS — I480 Paroxysmal atrial fibrillation: Secondary | ICD-10-CM

## 2014-01-04 NOTE — Progress Notes (Signed)
Patient moved from [facility] to [facility] - they will check INR's and adjust.

## 2014-01-20 ENCOUNTER — Other Ambulatory Visit (HOSPITAL_COMMUNITY): Payer: Self-pay | Admitting: Internal Medicine

## 2014-01-20 DIAGNOSIS — R1310 Dysphagia, unspecified: Secondary | ICD-10-CM

## 2014-02-02 ENCOUNTER — Ambulatory Visit (HOSPITAL_COMMUNITY)
Admission: RE | Admit: 2014-02-02 | Discharge: 2014-02-02 | Disposition: A | Discharge status: Home / Self Care | Payer: PRIVATE HEALTH INSURANCE | Source: Ambulatory Visit | Attending: Internal Medicine | Admitting: Internal Medicine

## 2014-02-02 DIAGNOSIS — IMO0001 Reserved for inherently not codable concepts without codable children: Secondary | ICD-10-CM | POA: Insufficient documentation

## 2014-02-02 DIAGNOSIS — R1310 Dysphagia, unspecified: Secondary | ICD-10-CM | POA: Insufficient documentation

## 2014-02-02 NOTE — Procedures (Signed)
Objective Swallowing Evaluation: Modified Barium Swallowing Study  Patient Details  Name: [name] MRN: [id number] Date of Birth: 07-26-1924  Today's Date: 02/02/2014 Time:  -     Past Medical History:  Past Medical History  Diagnosis Date  . Anemia   . Chronic constipation   . Bloating   . Colonic neoplasm   . Occult blood positive stool   . A-fib   . Hypertrophic cardiomyopathy   . Bipolar disorder   . Diastolic heart failure   . Pacemaker     placed and followed at [facility]  . Depression   . Chronic kidney disease   . Thrombocytosis   . CKD (chronic kidney disease) stage 3, GFR 30-59 ml/min 03/16/2013   Past Surgical History:  Past Surgical History  Procedure Laterality Date  . Colonoscopy  10/02/2010    W/POLYP  . Fracture surgery    . Pacemaker insertion      [facility]  . Cholecystectomy     HPI:  [name] is an 80 yo male referred by Dr. Eula Fried for MBSS due to concerns for aspiration. He is currently residing at [facility]. His daughter accompanied him to the evaluation, but did waited in the waiting room.      Recommendation/Prognosis  Clinical Impression:   Dysphagia Diagnosis: Mild oral phase dysphagia;Moderate pharyngeal phase dysphagia Clinical impression: Pt presents with mild oral phase and moderate pharyngeal phase dysphagia characterized by reduced lingual manipulation and difficulty maintaining liquids in oral cavity before spilling into pharynx, premature spillage to the level of the pyriforms (and sometimes laryngeal vestibule!) with delay in swallow initiation resulting in penetration before and aspiration after the swallow with thin and nectar-thick liquids to which pt was indescriminantly sensate to (most often did NOT feel). Controlled bolus size was variably successful in preventing aspiration with nectars; chin tuck ineffective. Best performance with honey-thick liquids by SMALL cup sip, cue to hold in mouth and then swallow; ok  for mechanical soft textures. Recommend D3/mech soft with honey-thick liquids with above strategies. Trial SLP therapy for dysphagia therapy, implementation of strategies, and diet tolerance.    Swallow Evaluation Recommendations:  Diet Recommendations: Dysphagia 3 (Mechanical Soft);Honey-thick liquid Liquid Administration via: Cup Medication Administration: Whole meds with puree Supervision: Patient able to self feed;Full supervision/cueing for compensatory strategies Compensations: Slow rate;Small sips/bites;Clear throat intermittently;Hard cough after swallow;Multiple dry swallows after each bite/sip Postural Changes and/or Swallow Maneuvers: Out of bed for meals;Seated upright 90 degrees;Upright 30-60 min after meal Oral Care Recommendations: Oral care BID Other Recommendations: Order thickener from pharmacy;Clarify dietary restrictions Follow up Recommendations: Skilled Nursing facility    Prognosis:   Fair   Individuals Consulted: Report Sent to : Facility (Comment) [facility]      General: Date of Onset: 01/20/14 Type of Study: Modified Barium Swallowing Study Reason for Referral: Objectively evaluate swallowing function Previous Swallow Assessment: None on record Diet Prior to this Study: Information not available Temperature Spikes Noted: No History of Recent Intubation: No Behavior/Cognition: Alert;Cooperative;Pleasant mood Oral Cavity - Dentition: Dentures, top;Dentures, bottom Oral Motor / Sensory Function: Within functional limits Self-Feeding Abilities: Able to feed self;Needs assist Patient Positioning: Upright in chair Baseline Vocal Quality: Hoarse Volitional Cough: Strong Volitional Swallow: Able to elicit Anatomy: Within functional limits Pharyngeal Secretions: Not observed secondary MBS   Reason for Referral:   Objectively evaluate swallowing function    Oral Phase: Oral Preparation/Oral Phase Oral Phase: Impaired Oral - Thin Oral - Thin Cup: Other  (Comment) (pt had difficulty  holding bolus in oral cavity without spilling over)   Pharyngeal Phase:  Pharyngeal Phase Pharyngeal Phase: Impaired Pharyngeal - Honey Pharyngeal - Honey Cup: Delayed swallow initiation;Premature spillage to valleculae;Premature spillage to pyriform sinuses;Reduced pharyngeal peristalsis;Penetration/Aspiration before swallow;Pharyngeal residue - valleculae;Pharyngeal residue - pyriform sinuses Penetration/Aspiration details (honey cup): Material does not enter airway;Material enters airway, remains ABOVE vocal cords then ejected out;Material enters airway, remains ABOVE vocal cords and not ejected out Pharyngeal - Nectar Pharyngeal - Nectar Teaspoon: Delayed swallow initiation;Premature spillage to valleculae;Premature spillage to pyriform sinuses;Reduced pharyngeal peristalsis;Reduced airway/laryngeal closure;Penetration/Aspiration before swallow;Pharyngeal residue - valleculae;Pharyngeal residue - pyriform sinuses Penetration/Aspiration details (nectar teaspoon): Material enters airway, remains ABOVE vocal cords then ejected out;Material enters airway, CONTACTS cords and not ejected out Pharyngeal - Nectar Cup: Delayed swallow initiation;Premature spillage to valleculae;Premature spillage to pyriform sinuses;Reduced pharyngeal peristalsis;Reduced airway/laryngeal closure;Penetration/Aspiration before swallow;Pharyngeal residue - valleculae;Penetration/Aspiration during swallow;Pharyngeal residue - pyriform sinuses;Trace aspiration Penetration/Aspiration details (nectar cup): Material enters airway, passes BELOW cords and not ejected out despite cough attempt by patient;Material enters airway, passes BELOW cords without attempt by patient to eject out (silent aspiration);Material enters airway, remains ABOVE vocal cords and not ejected out Pharyngeal - Thin Pharyngeal - Thin Cup: Delayed swallow initiation;Premature spillage to valleculae;Premature spillage to pyriform  sinuses;Reduced airway/laryngeal closure;Penetration/Aspiration before swallow;Penetration/Aspiration during swallow;Trace aspiration;Moderate aspiration;Pharyngeal residue - valleculae;Pharyngeal residue - pyriform sinuses Penetration/Aspiration details (thin cup): Material enters airway, passes BELOW cords without attempt by patient to eject out (silent aspiration);Material enters airway, passes BELOW cords and not ejected out despite cough attempt by patient Pharyngeal - Solids Pharyngeal - Puree: Delayed swallow initiation;Premature spillage to valleculae;Reduced tongue base retraction;Pharyngeal residue - valleculae Pharyngeal - Mechanical Soft: Delayed swallow initiation;Premature spillage to valleculae;Reduced tongue base retraction;Pharyngeal residue - valleculae Pharyngeal - Pill: Within functional limits (taken whole with puree) Pharyngeal Phase - Comment Pharyngeal Comment: Premature spillage to pyriforms which then spills over into laryngeal vestibule   Cervical Esophageal Phase  Cervical Esophageal Phase Cervical Esophageal Phase: Impaired Cervical Esophageal Phase - Solids Puree: Other (Comment);Esophageal backflow into the pharynx (noted x1 when pt coughed- ) Cervical Esophageal Phase - Comment Cervical Esophageal Comment: esophageal backflow to pyriforms/UES when pt coughed after the primary swallow   GN  Functional Assessment Tool Used: MBSS Functional Limitations: Swallowing Swallow Current Status (O3785): At least 60 percent but less than 80 percent impaired, limited or restricted Swallow Goal Status [phone]): At least 20 percent but less than 40 percent impaired, limited or restricted Swallow Discharge Status [phone]): At least 60 percent but less than 80 percent impaired, limited or restricted      Thank you,  Genene Churn, [place]  [facility] 02/02/2014, 5:28 PM

## 2014-03-13 ENCOUNTER — Ambulatory Visit (INDEPENDENT_AMBULATORY_CARE_PROVIDER_SITE_OTHER): Payer: PRIVATE HEALTH INSURANCE | Admitting: Cardiology

## 2014-03-13 ENCOUNTER — Encounter: Payer: Self-pay | Admitting: Cardiology

## 2014-03-13 VITALS — BP 123/64 | HR 75 | Wt 207.1 lb

## 2014-03-13 DIAGNOSIS — I422 Other hypertrophic cardiomyopathy: Secondary | ICD-10-CM

## 2014-03-13 DIAGNOSIS — I482 Chronic atrial fibrillation, unspecified: Secondary | ICD-10-CM

## 2014-03-13 DIAGNOSIS — I5032 Chronic diastolic (congestive) heart failure: Secondary | ICD-10-CM

## 2014-03-13 DIAGNOSIS — D518 Other vitamin B12 deficiency anemias: Secondary | ICD-10-CM

## 2014-03-13 LAB — CBC
HCT: 32.3 % — ABNORMAL LOW (ref 39.0–52.0)
HEMOGLOBIN: 10 g/dL — AB (ref 13.0–17.0)
MCH: 25.6 pg — ABNORMAL LOW (ref 26.0–34.0)
MCHC: 31 g/dL (ref 30.0–36.0)
MCV: 82.6 fL (ref 78.0–100.0)
Platelets: 547 10*3/uL — ABNORMAL HIGH (ref 150–400)
RBC: 3.91 MIL/uL — ABNORMAL LOW (ref 4.22–5.81)
RDW: 20.2 % — AB (ref 11.5–15.5)
WBC: 9 10*3/uL (ref 4.0–10.5)

## 2014-03-13 NOTE — Progress Notes (Signed)
HPI The patient presents for follow up of chest pain and atrial fibrillation and a pacemaker.  He lives in a nursing home in [place].  He gets around in a wheelchair and is quite frail. He reports that he just doesn't feel well. He does report dyspnea and says he feels like he can't get a deep breath but is not describing PND or orthopnea. He's not describing any acute decompensations. His oxygen saturation in the office today was 90%. He doesn't notice his heart beating. There's been no new chest pain. I did see that he was in the emergency room in May actually lost balance and reported a fall. There was also an abscess on his back. I look at his labs and he was noted to have a hemoglobin of 8.6. His guaiac was negative. He does not report any visible blood loss.   Allergies  Allergen Reactions  . Abilify [Aripiprazole]     Other reaction(s): Other (See Comments) Hallucinations  . Demerol   . Meperidine     Other reaction(s): Mental Status Changes (intolerance)  . Morphine Other (See Comments)    Hallucinations  . Morphine And Related   . Risperdal [Risperidone] Other (See Comments)    Weakness, tiredness    Current Outpatient Prescriptions  Medication Sig Dispense Refill  . acetaminophen (TYLENOL) 500 MG tablet Take 1,000 mg by mouth every 6 (six) hours as needed for mild pain or headache.      Marland Kitchen atorvastatin (LIPITOR) 40 MG tablet Take 1 tablet (40 mg total) by mouth daily at 6 PM.  30 tablet  5  . diphenhydrAMINE (BENADRYL) 25 mg capsule Take 25 mg by mouth every 6 (six) hours as needed.      Marland Kitchen esomeprazole (NEXIUM) 20 MG capsule Take 20 mg by mouth daily at 12 noon.      . fenofibrate 160 MG tablet Take 1 tablet (160 mg total) by mouth daily.  30 tablet  5  . furosemide (LASIX) 20 MG tablet Take 20-40 mg by mouth 2 (two) times daily. Take two tablets by mouth daily in the morning and one tablet in the evening      . gabapentin (NEURONTIN) 100 MG capsule Take 100 mg by  mouth 2 (two) times daily.       Marland Kitchen loratadine (CLARITIN) 10 MG tablet Take 10 mg by mouth daily.      . metoprolol tartrate (LOPRESSOR) 25 MG tablet Take 25 mg by mouth 2 (two) times daily.      . Multiple Vitamins-Minerals (CERTA-VITE SENIOR-LUTEIN PO) Take 1 tablet by mouth every morning.      . nitroGLYCERIN (NITRODUR - DOSED IN MG/24 HR) 0.2 mg/hr Place 1 patch onto the skin daily.      . potassium chloride (KLOR-CON) 20 MEQ packet Take 20 mEq by mouth daily.       . risperiDONE (RISPERDAL) 1 MG tablet Take 1 mg by mouth 2 (two) times daily.      Marland Kitchen senna (SENOKOT) 8.6 MG TABS Take 2 tablets by mouth at bedtime.       . vitamin B-12 (CYANOCOBALAMIN) 1000 MCG tablet Take 1,000 mcg by mouth daily.        Marland Kitchen warfarin (COUMADIN) 2 MG tablet Take 2mg  daily except on Wednesdays and Saturdays take 4mg .  **Corrected Dosage**  40 tablet  0  . zolpidem (AMBIEN) 5 MG tablet Take 5 mg by mouth at bedtime.       No current facility-administered  medications for this visit.    Past Medical History  Diagnosis Date  . Anemia   . Chronic constipation   . Bloating   . Colonic neoplasm   . Occult blood positive stool   . A-fib   . Hypertrophic cardiomyopathy   . Bipolar disorder   . Diastolic heart failure   . Pacemaker     [facility] and [facility]  . Depression   . Chronic kidney disease   . Thrombocytosis   . CKD (chronic kidney disease) stage 3, GFR 30-59 ml/min 03/16/2013    Past Surgical History  Procedure Laterality Date  . Colonoscopy  10/02/2010    W/POLYP  . Fracture surgery    . Pacemaker insertion      [facility]  . Cholecystectomy      ROS:  Arthritis.  As stated in the HPI and negative for all other systems.  PHYSICAL EXAM BP 123/64  Pulse 75  Wt 207 lb 1.6 oz (93.94 kg) PHYSICAL EXAM GEN:  No distress, frail NECK:  No jugular venous distention at 90 degrees, waveform within normal limits, carotid upstroke brisk and symmetric, no bruits, no thyromegaly LYMPHATICS:   No cervical adenopathy LUNGS:  Clear to auscultation bilaterally BACK:  No CVA tenderness CHEST:  Unremarkable, healed pacemaker pocket.  HEART:  S1 and S2 within normal limits, no S3, no S4, no clicks, no rubs, 2/6 apical systolic murmur radiating slightly out the aortic outflow tract, no diastolic murmurs ABD:  Positive bowel sounds normal in frequency in pitch, no bruits, no rebound, no guarding, unable to assess midline mass or bruit with the patient seated. EXT:  2 plus pulses throughout, moderate edema, no cyanosis no clubbing SKIN:  No rashes no nodules NEURO:  Cranial nerves II through XII grossly intact, motor grossly intact throughout, muscle wasting PSYCH:  Cognitively intact, oriented to person place and time   EKG:  Atrial fibrillation with ventricular pacing 100% capture, rate 75  03/13/2014  ASSESSMENT AND PLAN  ATRIAL FIBRILLATION:  The patient doesn't notice this rhythm. He was guaiac-negative earlier this year but with new anemia. I will check a CBC and order followup why asked. For now he is high risk for stroke and so should continue his anticoagulation.  DIASTOLIC HF:  He seems to be euvolemic. No change in therapy is indicated.  CKD:  This was stable when last checked and I will defer to his primary providers.  PACEMAKER PLACEMENT:  It seems like he is due for pacemaker followup.  CHEST PAIN:  He has no new symptoms in her previous negative workup for ischemia. No change in therapy is indicated.  ANEMIA:  As above.

## 2014-03-13 NOTE — Patient Instructions (Signed)
Your physician recommends that you schedule a follow-up appointment in: one year with Dr. Percival Spanish  Do Three quiac stool cards  Have a CBC done today  Set up for Pacemaker check  Report on who is doing INR checks

## 2014-03-15 ENCOUNTER — Telehealth: Payer: Self-pay | Admitting: *Deleted

## 2014-03-15 NOTE — Telephone Encounter (Signed)
Faxed lab results to patient's facility - nurse

## 2014-03-16 ENCOUNTER — Telehealth: Payer: Self-pay | Admitting: Cardiology

## 2014-03-16 NOTE — Telephone Encounter (Signed)
[name] at Dr. Georgia Duff office stated she would call [place] facility and make sure some one was following [name] coumadin

## 2014-03-16 NOTE — Telephone Encounter (Signed)
Please call.

## 2014-03-16 NOTE — Telephone Encounter (Signed)
[name] has been informed that [name] is now at [facility] . [name]  Was given the number to the facility to set up pacemaker checks

## 2014-03-20 ENCOUNTER — Telehealth: Payer: Self-pay | Admitting: Pharmacist

## 2014-03-20 NOTE — Telephone Encounter (Signed)
Received call and it was also sent to Dr. Percival Spanish

## 2014-03-20 NOTE — Telephone Encounter (Signed)
I had received called from [place] at [facility]  / Dr Hocherin's office.  They were concerned that INR was not being checked regularly.  Explained that patient is now at [facility] and they usually take over checking INR/Protimes and that these results would not be in [place].  [place] and confirmed that last INR was 03/17/2014 and was 1.9 - warfarin adjusted by their nurse practitioner - Orlean Bradford.  Will forward to Dr Jenkins Rouge.

## 2014-03-24 ENCOUNTER — Telehealth: Payer: Self-pay | Admitting: Cardiology

## 2014-03-24 NOTE — Telephone Encounter (Signed)
Attempted to call [name] but she had already left for the day.  Per PaceArt, pt has a SJM device from 1995--never been checked in our offices.

## 2014-03-24 NOTE — Telephone Encounter (Signed)
New Message  03-22-14 LMM @ 36PM FOR [name] AT [facility] TO CALL ME BACK TO SCHEDULE PACER CHECK WITH DEVICE CLINIC, OFFERED [place], THEY HAVE 10-23 AT 330P, OR THEY CAN COME TO /MT  (that is the message [facility] sent, Oren Section called back stating she has the opportunity top complete a device check within their facility no need to come in... Request a call back to discuss if needed)

## 2014-03-27 NOTE — Telephone Encounter (Signed)
Pt has wound check w/ device clinic on 03/30/14.

## 2014-03-30 ENCOUNTER — Ambulatory Visit (INDEPENDENT_AMBULATORY_CARE_PROVIDER_SITE_OTHER): Payer: PRIVATE HEALTH INSURANCE | Admitting: *Deleted

## 2014-03-30 DIAGNOSIS — I4819 Other persistent atrial fibrillation: Secondary | ICD-10-CM

## 2014-03-30 DIAGNOSIS — I481 Persistent atrial fibrillation: Secondary | ICD-10-CM

## 2014-03-30 LAB — MDC_IDC_ENUM_SESS_TYPE_INCLINIC
Battery Impedance: 1000 Ohm — CL
Implantable Pulse Generator Model: 5156
Lead Channel Impedance Value: 692 Ohm
Lead Channel Pacing Threshold Pulse Width: 0.5 ms
Lead Channel Sensing Intrinsic Amplitude: 6 mV
Lead Channel Setting Sensing Sensitivity: 2 mV
MDC IDC MSMT BATTERY VOLTAGE: 2.75 V
MDC IDC MSMT LEADCHNL RV PACING THRESHOLD AMPLITUDE: 1.25 V
MDC IDC PG SERIAL: 1625320
MDC IDC SESS DTM: 20151022102845
MDC IDC SET LEADCHNL RV PACING PULSEWIDTH: 0.5 ms
MDC IDC STAT BRADY RV PERCENT PACED: 98 %

## 2014-03-30 NOTE — Progress Notes (Signed)
Pacemaker check in clinic. Normal device function. Thresholds, sensing, impedances consistent with previous measurements. Device programmed to maximize longevity. No high ventricular rates noted. Device programmed at appropriate safety margins. Histogram distribution blunted, rate response programmed on today and lower rate reprogrammed to 70.   Device programmed to optimize intrinsic conduction. Estimated longevity 8 years.  Patient education completed.  ROV 6 months with Dr. Lovena Le.

## 2014-04-26 ENCOUNTER — Encounter: Payer: Self-pay | Admitting: Internal Medicine

## 2014-09-12 ENCOUNTER — Ambulatory Visit (INDEPENDENT_AMBULATORY_CARE_PROVIDER_SITE_OTHER): Payer: Medicare Other | Admitting: Internal Medicine

## 2014-09-12 ENCOUNTER — Encounter: Payer: Self-pay | Admitting: Internal Medicine

## 2014-09-12 VITALS — BP 100/72 | HR 70 | Ht 74.5 in | Wt 194.8 lb

## 2014-09-12 DIAGNOSIS — I482 Chronic atrial fibrillation, unspecified: Secondary | ICD-10-CM

## 2014-09-12 DIAGNOSIS — Z950 Presence of cardiac pacemaker: Secondary | ICD-10-CM | POA: Diagnosis not present

## 2014-09-12 DIAGNOSIS — I5032 Chronic diastolic (congestive) heart failure: Secondary | ICD-10-CM | POA: Diagnosis not present

## 2014-09-12 LAB — MDC_IDC_ENUM_SESS_TYPE_INCLINIC
Battery Impedance: 1000 Ohm — CL
Date Time Interrogation Session: 20160405131449
Implantable Pulse Generator Model: 5156
Implantable Pulse Generator Serial Number: 1625320
Lead Channel Pacing Threshold Amplitude: 1.125 V
MDC IDC MSMT BATTERY VOLTAGE: 2.74 V
MDC IDC MSMT LEADCHNL RV IMPEDANCE VALUE: 717 Ohm
MDC IDC MSMT LEADCHNL RV PACING THRESHOLD PULSEWIDTH: 0.5 ms
MDC IDC SET LEADCHNL RV PACING PULSEWIDTH: 0.5 ms
MDC IDC SET LEADCHNL RV SENSING SENSITIVITY: 2 mV

## 2014-09-12 NOTE — Assessment & Plan Note (Signed)
His symptoms are class II. He is sedentary. He is encouraged to maintain a low-sodium diet.

## 2014-09-12 NOTE — Progress Notes (Signed)
HPI Mr. Inabinet returns today for pacemaker follow-up. He is a very pleasant 81 year old man with multiple medical problems, currently residing in a skilled nursing facility, with a history of sinus node dysfunction, chronic atrial fibrillation, and symptomatic bradycardia, status post permanent pacemaker insertion. The patient is wheelchair bound, unable to walk any significant distance. He is able to transfer with assistance. He has been hospitalized in the past with bronchitis and pneumonia, now much improved. He denies chest pain or shortness of breath. No syncope. Allergies  Allergen Reactions  . Abilify [Aripiprazole]     Other reaction(s): Other (See Comments) Hallucinations  . Demerol     unknown  . Meperidine     Other reaction(s): Mental Status Changes (intolerance)  . Morphine Other (See Comments)    Hallucinations  . Morphine And Related     Hallucinations  . Risperdal [Risperidone] Other (See Comments)    Weakness, tiredness     Current Outpatient Prescriptions  Medication Sig Dispense Refill  . acetaminophen (TYLENOL) 650 MG CR tablet Take 650 mg by mouth 3 (three) times daily as needed for pain. TYLENOL ARTHRITIS    . atorvastatin (LIPITOR) 40 MG tablet Take 1 tablet (40 mg total) by mouth daily at 6 PM. 30 tablet 5  . cephALEXin (KEFLEX) 500 MG capsule Take 500 mg by mouth 3 (three) times daily. Take with a meal    . cetirizine (ZYRTEC) 5 MG tablet Take 5 mg by mouth daily.    . Cholecalciferol (VITAMIN D3) 50000 UNITS CAPS Take 50,000 Units by mouth every 30 (thirty) days.    Marland Kitchen esomeprazole (NEXIUM) 20 MG capsule Take 20 mg by mouth daily at 12 noon.    . fenofibrate 160 MG tablet Take 1 tablet (160 mg total) by mouth daily. 30 tablet 5  . ferrous sulfate 325 (65 FE) MG tablet Take 325 mg by mouth as directed. Take before meals, give with OJ    . furosemide (LASIX) 20 MG tablet Take 80 mg by mouth 2 (two) times daily.     Marland Kitchen gabapentin (NEURONTIN) 100 MG  capsule Take 100 mg by mouth 2 (two) times daily.     Marland Kitchen ipratropium-albuterol (DUONEB) 0.5-2.5 (3) MG/3ML SOLN Take 3 mLs by nebulization every 6 (six) hours as needed (congestion).    Marland Kitchen lisinopril (PRINIVIL,ZESTRIL) 2.5 MG tablet Take 2.5 mg by mouth daily.    . metoprolol tartrate (LOPRESSOR) 25 MG tablet Take 25 mg by mouth 2 (two) times daily.    . nitroGLYCERIN (NITRODUR - DOSED IN MG/24 HR) 0.2 mg/hr Place 1 patch onto the skin daily.    . potassium chloride (KLOR-CON) 20 MEQ packet Take 20 mEq by mouth 2 (two) times daily.     . Probiotic Product (PROBIOTIC DAILY PO) Take 1 capsule by mouth 2 (two) times daily.    . risperiDONE (RISPERDAL) 1 MG tablet Take 0.5 mg by mouth daily. 0.5mg  in the am and 1 mg in the pm    . senna (SENOKOT) 8.6 MG TABS Take 1 tablet by mouth at bedtime.     . tamsulosin (FLOMAX) 0.4 MG CAPS capsule Take 0.4 mg by mouth daily.     . vitamin B-12 (CYANOCOBALAMIN) 1000 MCG tablet Take 1,000 mcg by mouth daily.      Marland Kitchen warfarin (COUMADIN) 2 MG tablet Take 2mg  daily except on Wednesdays and Saturdays take 4mg .  **Corrected Dosage** 40 tablet 0  . zolpidem (AMBIEN) 5 MG tablet Take 5 mg by  mouth at bedtime as needed for sleep.      No current facility-administered medications for this visit.     Past Medical History  Diagnosis Date  . Anemia   . Chronic constipation   . Bloating   . Colonic neoplasm   . Occult blood positive stool   . A-fib   . Hypertrophic cardiomyopathy   . Bipolar disorder   . Diastolic heart failure   . Pacemaker     placed and followed at [facility]  . Depression   . Chronic kidney disease   . Thrombocytosis   . CKD (chronic kidney disease) stage 3, GFR 30-59 ml/min 03/16/2013    ROS:   All systems reviewed and negative except as noted in the HPI.   Past Surgical History  Procedure Laterality Date  . Colonoscopy  10/02/2010    W/POLYP  . Fracture surgery    . Pacemaker insertion      [facility]  . Cholecystectomy        Family History  Problem Relation Age of Onset  . Heart disease Mother   . Heart disease Sister   . Heart disease Brother      History   Social History  . Marital Status: Widowed    Spouse Name: N/A  . Number of Children: N/A  . Years of Education: N/A   Occupational History  . Not on file.   Social History Main Topics  . Smoking status: Former Smoker    Quit date: 10/05/1972  . Smokeless tobacco: Never Used  . Alcohol Use: No  . Drug Use: No  . Sexual Activity: No   Other Topics Concern  . Not on file   Social History Narrative     BP 100/72 mmHg  Pulse 70  Ht 6' 2.5" (1.892 m)  Wt 194 lb 12.8 oz (88.361 kg)  BMI 24.68 kg/m2  Physical Exam:  Chronically ill appearing elderly man, NAD HEENT: Unremarkable Neck:  No JVD, no thyromegally Lymphatics:  No adenopathy Back:  No CVA tenderness Lungs:  Clear, with no wheezes, rales, or rhonchi. HEART:  Regular rate rhythm, no murmurs, no rubs, no clicks Abd:  soft, positive bowel sounds, no organomegally, no rebound, no guarding Ext:  2 plus pulses, no edema, no cyanosis, no clubbing Skin:  No rashes no nodules Neuro:  CN II through XII intact, motor grossly intact   DEVICE  Normal device function.  See PaceArt for details.   Assess/Plan:

## 2014-09-12 NOTE — Assessment & Plan Note (Signed)
His [employer] single chamber pacemaker is working normally. We'll plan to recheck in several months. 

## 2014-09-12 NOTE — Assessment & Plan Note (Signed)
His ventricular rate is well controlled. He will continue his current strategy of rate control. He will continue systemic anticoagulation.

## 2014-09-12 NOTE — Patient Instructions (Addendum)
Your physician wants you to follow-up in: 12 months with Dr. Lovena Le in [place].   You will receive a reminder letter in the mail two months in advance. If you don't receive a letter, please call our office to schedule the follow-up appointment.  Your physician wants you to follow-up in: 6 months for Device Check in [place].   You will receive a reminder letter in the mail two months in advance. If you don't receive a letter, please call our office to schedule the follow-up appointment.

## 2014-09-28 ENCOUNTER — Encounter: Payer: Self-pay | Admitting: Internal Medicine

## 2014-11-13 ENCOUNTER — Other Ambulatory Visit (HOSPITAL_COMMUNITY): Payer: Self-pay | Admitting: Internal Medicine

## 2014-11-13 DIAGNOSIS — R05 Cough: Secondary | ICD-10-CM

## 2014-11-13 DIAGNOSIS — R0600 Dyspnea, unspecified: Secondary | ICD-10-CM

## 2014-11-13 DIAGNOSIS — R059 Cough, unspecified: Secondary | ICD-10-CM

## 2014-11-16 ENCOUNTER — Ambulatory Visit (HOSPITAL_COMMUNITY)
Admission: RE | Admit: 2014-11-16 | Discharge: 2014-11-16 | Disposition: A | Discharge status: Home / Self Care | Payer: Medicare Other | Source: Ambulatory Visit | Attending: Internal Medicine | Admitting: Internal Medicine

## 2014-11-16 DIAGNOSIS — R0600 Dyspnea, unspecified: Secondary | ICD-10-CM | POA: Diagnosis not present

## 2014-11-16 DIAGNOSIS — R05 Cough: Secondary | ICD-10-CM | POA: Diagnosis not present

## 2014-11-16 DIAGNOSIS — R5383 Other fatigue: Secondary | ICD-10-CM | POA: Diagnosis not present

## 2014-11-16 DIAGNOSIS — R059 Cough, unspecified: Secondary | ICD-10-CM

## 2014-11-16 MED ORDER — IOHEXOL 300 MG/ML  SOLN
75.0000 mL | Freq: Once | INTRAMUSCULAR | Status: AC | PRN
  Administered 2014-11-16: 75 mL via INTRAVENOUS

## 2015-02-04 ENCOUNTER — Non-Acute Institutional Stay (SKILLED_NURSING_FACILITY): Payer: Medicare Other | Admitting: Internal Medicine

## 2015-02-04 DIAGNOSIS — N184 Chronic kidney disease, stage 4 (severe): Secondary | ICD-10-CM

## 2015-02-04 DIAGNOSIS — I5032 Chronic diastolic (congestive) heart failure: Secondary | ICD-10-CM | POA: Diagnosis not present

## 2015-02-04 DIAGNOSIS — N189 Chronic kidney disease, unspecified: Secondary | ICD-10-CM | POA: Diagnosis not present

## 2015-02-04 DIAGNOSIS — F319 Bipolar disorder, unspecified: Secondary | ICD-10-CM | POA: Diagnosis not present

## 2015-02-04 DIAGNOSIS — N182 Chronic kidney disease, stage 2 (mild): Secondary | ICD-10-CM

## 2015-02-04 DIAGNOSIS — N181 Chronic kidney disease, stage 1: Secondary | ICD-10-CM | POA: Diagnosis not present

## 2015-02-04 DIAGNOSIS — I129 Hypertensive chronic kidney disease with stage 1 through stage 4 chronic kidney disease, or unspecified chronic kidney disease: Secondary | ICD-10-CM

## 2015-02-04 DIAGNOSIS — I422 Other hypertrophic cardiomyopathy: Secondary | ICD-10-CM

## 2015-02-04 DIAGNOSIS — N185 Chronic kidney disease, stage 5: Secondary | ICD-10-CM

## 2015-02-04 DIAGNOSIS — I482 Chronic atrial fibrillation, unspecified: Secondary | ICD-10-CM

## 2015-02-04 DIAGNOSIS — N183 Chronic kidney disease, stage 3 (moderate): Secondary | ICD-10-CM

## 2015-02-09 NOTE — Progress Notes (Addendum)
Patient ID: [name], male   DOB: [birth year], 81 y.o.   MRN: [id number]                HISTORY & PHYSICAL  DATE:  02/04/2015          FACILITY: [facility]                  LEVEL OF CARE:   SNF   CHIEF COMPLAINT:  Transfer to my service in the facility.    HISTORY OF PRESENT ILLNESS:  This is a patient who is transferring to my care in the facility.  He is also under [facility] care in the building, as well.  By review of the records, it appears that he has been here since early July 2015.  Prior to this, he lived in [place].    He is a patient who apparently has background psychiatric disorders, possibly bipolar.  He also scored 16/30 on the Folstein mini-mental status.  He is followed by Psychiatry in the facility.    He had a recent urine culture showing greater than 100,000 Proteus.  He was treated with Bactrim.    The patient is on Coumadin chronically for atrial fibrillation.    LABORATORY DATA:  Most recent lab work from 11/29/2014:     Sodium 140, potassium 4.6, BUN 25, creatinine 1.63, estimated GFR at 37.     LDL cholesterol 55.    PAST MEDICAL HISTORY/PROBLEM LIST:                      Thrombocytosis.    Long-term use of warfarin.     Osteoarthritis.    Hypertrophic cardiomyopathy.    Diastolic heart failure.    Dyspnea.    Depression.    Chronic renal failure stage III.    Chest pain, felt to be musculoskeletal.        Cardiac pacemaker.    Bipolar disorder.    Chronic atrial fibrillation.     Anemia.    ACL tear.    PAST SURGICAL HISTORY:         Pacemaker insertion.    Colonoscopy in April 2012.    Cholecystectomy.      CURRENT MEDICATIONS:  Medication list is reviewed.            Fenofibrate 160 daily.    Flomax 0.4 q.d.      Vitamin B12, 1000 daily.    Nitro-Dur 0.2, 1 patch topically every morning and removed at bedtime.     Risperdal 0.5 q.a.m.      Lisinopril 2.5 q.d.      Nexium 20 q.d.      Allegra 60 q.d. for  allergic symptoms.    Lopressor 25 b.i.d.      K-Dur 20 b.i.d.       Lasix 80 b.i.d.      Gabapentin 100 b.i.d.      Ocuvite 1 tablet twice a day.    Ferrous sulfate 325 three times a day.    Lipitor 80 q.d.      Senokot 8.6 q.d.      Risperdal 1 mg every night at bedtime.     Doxycycline 100 b.i.d. for 10 days.    Coumadin 2 mg daily.    SOCIAL HISTORY:                   TOBACCO USE:   The patient is a former smoker, quitting in 66.  ALCOHOL:  No history of alcohol use.   ADVANCED DIRECTIVES:  The patient is a DNR and Do Not Hospitalize in the facility.    FAMILY HISTORY:         MOTHER:  Heart disease.    SIBLINGS:  Heart disease in two siblings.    REVIEW OF SYSTEMS:      General: States he feels well, no fever chills  HEENT:   No headache.   CHEST/RESPIRATORY:  He complains of shortness of breath, but no cough.   CARDIAC:  Episodic nondescript chest pain.   I cannot really get him to describe this.  It is not exertional.   GI:  No nausea, vomiting, or abdominal pain.       GU:  He does not describe dysuria or flank pain.   ARTERIAL:  Extremities:  No claudication. MSK; no joint pain, no back pain    PHYSICAL EXAMINATION:   VITAL SIGNS:     PULSE:  72.   RESPIRATIONS:  20.    02 SATURATIONS:  95% on room air.   GENERAL APPEARANCE:  The patient is not in any distress.   Looks like perioral cyanosis.         HEENT:   MOUTH/THROAT:    No oral lesions.    CHEST/RESPIRATORY:  Shallow air entry, but no crackles or wheezes.   CARDIOVASCULAR:   CARDIAC:  3/6 pansystolic murmur at the lower left sternal border.  Left parasternal lift.  His jugular venous pressure is not elevated.  There are no carotid bruits.     GASTROINTESTINAL:   ABDOMEN:  Obese.  No masses.     LIVER/SPLEEN/KIDNEY:   No liver, no spleen.  No tenderness.   No stigmata of chronic liver disease.   GENITOURINARY:   BLADDER:  No bladder distention.  No tenderness.   No CVA tenderness.     CIRCULATION:   ARTERIAL:  Extremities:  Reduced peripheral pulses.  I cannot feel pulses in his feet or ankles.   NEUROLOGICAL:   SENSATION/STRENGTH:  He appears to be able to move all his limbs.   DEEP TENDON REFLEXES:  Reflexes symmetric.   OTHER:  He has what appears to be essential tremors.     PSYCHIATRIC:   MENTAL STATUS:    He does not currently admit to depression.  No obvious manic symptoms.    ASSESSMENT/PLAN:                        Listed as having a hypertrophic cardiomyopathy.  Last echocardiogram was in 2014.  He had a normal EF.   Left atrium and right atrium, both severely dilated.  Moderate tricuspid regurgitation.  Pulmonary artery pressure at 43.  I do not see a hypertrophic cardiomyopathy described.    Chronic atrial fibrillation.  His heart rate is controlled on Coumadin.    History of diastolic heart failure.  He is on a large dose of Lasix.  There is none of that currently evident.   History of chronic renal failure.  Secondary to nephrosclerosis with stage III chronic renal failure.    Bipolar disorder.  He is followed by Psychiatry here.   He appears to be stable.    ?Moderate dementia.    Folstein mini-mental status is 16/30, as noted on the chart.    Central cyanosis without obvious cause.  His O2 sat is 95% on room air.  He has no peripheral cyanosis.    BPH.  No evidence that this is active.    Status post right ventricular pacemaker.    History of noncardiac chest pain.

## 2015-02-21 ENCOUNTER — Non-Acute Institutional Stay (SKILLED_NURSING_FACILITY): Payer: Medicare Other | Admitting: Internal Medicine

## 2015-02-21 DIAGNOSIS — N189 Chronic kidney disease, unspecified: Secondary | ICD-10-CM

## 2015-02-21 DIAGNOSIS — N183 Chronic kidney disease, stage 3 (moderate): Secondary | ICD-10-CM

## 2015-02-21 DIAGNOSIS — I5032 Chronic diastolic (congestive) heart failure: Secondary | ICD-10-CM

## 2015-02-21 DIAGNOSIS — N181 Chronic kidney disease, stage 1: Secondary | ICD-10-CM | POA: Diagnosis not present

## 2015-02-21 DIAGNOSIS — N182 Chronic kidney disease, stage 2 (mild): Secondary | ICD-10-CM | POA: Diagnosis not present

## 2015-02-21 DIAGNOSIS — I129 Hypertensive chronic kidney disease with stage 1 through stage 4 chronic kidney disease, or unspecified chronic kidney disease: Secondary | ICD-10-CM

## 2015-02-21 DIAGNOSIS — I482 Chronic atrial fibrillation, unspecified: Secondary | ICD-10-CM

## 2015-02-21 DIAGNOSIS — N184 Chronic kidney disease, stage 4 (severe): Secondary | ICD-10-CM | POA: Diagnosis not present

## 2015-02-21 DIAGNOSIS — N185 Chronic kidney disease, stage 5: Secondary | ICD-10-CM

## 2015-02-26 NOTE — Progress Notes (Addendum)
Patient ID: [name], male   DOB: 1924-09-28, 81 y.o.   MRN: [id number]                PROGRESS NOTE  DATE:  02/21/2015             FACILITY: [facility]                  LEVEL OF CARE:   SNF   Acute Visit            CHIEF COMPLAINT:  Follow up admission last week.      HISTORY OF PRESENT ILLNESS:  This is a patient whom I accepted in transfer to my service two weeks ago.  He has been in the building since July 2015.     He has moderately advanced dementia and also a history of bipolar disorder.   He follows with Psychiatry in the facility.  This actually seems stable.  His Folstein mini-mental status score is 16/30.    The patient is on Coumadin for chronic atrial fibrillation.   He is listed in the facility as having a hypertrophic cardiomyopathy, although I do not really see this described.  He does have tricuspid regurgitation accounting for his pansystolic murmur and left parasternal lift, probably secondary to pulmonary artery hypertension.      By review of the records, everything appears to be stable here.    His most recent weight is 209 pounds which, with some fluctuation, appears to be stable since the beginning of the year.    CURRENT MEDICATIONS:  Medication list is reviewed.            Tricor 160 q.d.      Flomax 0.4 q.d.      Vitamin B12, 1000 mcg tablet, 1 daily.     Nitro-Dur 0.2 mg per hour patch.    Risperdal 0.5 in the evening.     Prinivil 2.5 daily.      Vitamin D3, 50,000 U monthly.     Nexium 20 mg daily.      Allegra 60 mg daily.      Lopressor 25 b.i.d.      K-Dur 20 b.i.d.      Lasix 80 b.i.d.      Neurontin 100 b.i.d.      Ferrous sulfate 325 three times daily.      Lipitor 40 q.d.      Senokot 8.6 q.d.      Risperdal 0.5, 2 tablets/1 mg at bedtime.    Most recent Coumadin dose at 2 mg daily, with an INR check listed for next week.    LABORATORY DATA:   Last lab work on 11/29/2014:      BUN 25, creatinine 1.63,  potassium normal at 4.6.  The rest of the comprehensive metabolic panel was normal.     LDL 55.    REVIEW OF SYSTEMS:   Of dubious quality in a man with moderately advanced dementia.  However:   GENERAL:  He states he feels well.  He has no complaints.     HEENT:   No headache.    CHEST/RESPIRATORY:  No shortness of breath.  No cough.      CARDIAC:   He described nondescript chest pain to me two weeks ago.  I cannot get him to talk about this today.      GI:  No nausea or vomiting.    GU:  No dysuria.    MUSCULOSKELETAL:  Extremities:  No pain described.  No joint or back pain.      PHYSICAL EXAMINATION:   VITAL SIGNS:     TEMPERATURE:  97.1.      PULSE:  82.     RESPIRATIONS:  20.    BLOOD PRESSURE:  110/70.     02 SATURATIONS:  96% on room air.    CHEST/RESPIRATORY:  Shallow air entry, but no crackles or wheezes.       CARDIOVASCULAR:   CARDIAC:  There is a 3/6 pansystolic murmur at the lower left sternal border.  Jugular venous pressure is not elevated.    There are no carotid bruits.    GASTROINTESTINAL:   ABDOMEN:  No masses.    LIVER/SPLEEN/KIDNEYS:  No liver, no spleen.  No tenderness.        GENITOURINARY:   BLADDER:  Not distended.  There is no tenderness.  No CVA tenderness.     CIRCULATION:  I cannot feel peripheral pulses in his feet or ankles.    NEUROLOGICAL:   He is stable.  He appears to have essential tremors rather than Parkinson's tremor.    ASSESSMENT/PLAN:                  Tricuspid regurgitation with pulmonary hypertension.   This seems stable.    Chronic atrial fibrillation.  Rate is controlled.  He is anticoagulated on Coumadin.    History of diastolic heart failure.  He will need follow-up electrolytes.   Stable at the   Chronic renal failure stage III.  This seems stable.     Moderate dementia with a background bipolar disorder.   Followed by Psychiatry.  Here again, this is stable.      CPT CODE: 88916          ADDENDUM:  Electrolytes ordered.   We will follow up on his weights next week.

## 2015-03-14 ENCOUNTER — Ambulatory Visit (INDEPENDENT_AMBULATORY_CARE_PROVIDER_SITE_OTHER): Payer: Medicare Other | Admitting: Cardiology

## 2015-03-14 ENCOUNTER — Encounter: Payer: Self-pay | Admitting: Cardiology

## 2015-03-14 VITALS — BP 113/67 | HR 70 | Ht 75.0 in

## 2015-03-14 DIAGNOSIS — I4891 Unspecified atrial fibrillation: Secondary | ICD-10-CM | POA: Diagnosis not present

## 2015-03-14 DIAGNOSIS — Z950 Presence of cardiac pacemaker: Secondary | ICD-10-CM | POA: Diagnosis not present

## 2015-03-14 MED ORDER — ISOSORBIDE MONONITRATE ER 30 MG PO TB24: 30.0000 mg | ORAL_TABLET | Freq: Every day | ORAL | Status: DC

## 2015-03-14 NOTE — Patient Instructions (Signed)
Medication Instructions:  Please start Isosorbide 30 mg once a day. Continue all other medications as listed.  Follow-Up: Follow up in 1 year with Dr. Percival Spanish.  You will receive a letter in the mail 2 months before you are due.  Please call us when you receive this letter to schedule your follow up appointment.  Thank you for choosing [facility]!!

## 2015-03-14 NOTE — Progress Notes (Signed)
HPI The patient presents for follow up of chest pain and atrial fibrillation and a pacemaker.  He lives in a nursing home.  He gets around in a wheelchair and is quite frail. He has chronic dyspnea.  He does have chest discomfort. He had a negative stress perfusion study in 2014. Currently he is coming down with a cough. He's not been having fevers and chills however. He does have chest discomfort. Comes on sporadically. He's very limited in his activities and can't bring it on with activity. However, he describes a left upper chest heaviness with some radiation to the shoulder. He doesn't describe associated symptoms.    Allergies  Allergen Reactions  . Abilify [Aripiprazole]     Other reaction(s): Other (See Comments) Hallucinations  . Demerol     unknown  . Meperidine     Other reaction(s): Mental Status Changes (intolerance)  . Morphine Other (See Comments)    Hallucinations  . Morphine And Related     Hallucinations  . Risperdal [Risperidone] Other (See Comments)    Weakness, tiredness    Current Outpatient Prescriptions  Medication Sig Dispense Refill  . acetaminophen (TYLENOL) 650 MG CR tablet Take 650 mg by mouth 3 (three) times daily as needed for pain. TYLENOL ARTHRITIS    . atorvastatin (LIPITOR) 40 MG tablet Take 1 tablet (40 mg total) by mouth daily at 6 PM. 30 tablet 5  . beta carotene w/minerals (OCUVITE) tablet Take 1 tablet by mouth daily.    . Cholecalciferol (VITAMIN D3) 50000 UNITS CAPS Take 50,000 Units by mouth every 30 (thirty) days.    Marland Kitchen esomeprazole (NEXIUM) 20 MG capsule Take 20 mg by mouth daily at 12 noon.    . fenofibrate 160 MG tablet Take 1 tablet (160 mg total) by mouth daily. 30 tablet 5  . ferrous sulfate 325 (65 FE) MG tablet Take 325 mg by mouth as directed. Take before meals, give with OJ    . fexofenadine (ALLEGRA) 60 MG tablet Take 60 mg by mouth daily.    . furosemide (LASIX) 20 MG tablet Take 80 mg by mouth 2 (two) times daily.     Marland Kitchen  gabapentin (NEURONTIN) 100 MG capsule Take 100 mg by mouth 2 (two) times daily.     Marland Kitchen lisinopril (PRINIVIL,ZESTRIL) 2.5 MG tablet Take 2.5 mg by mouth daily.    . metoprolol tartrate (LOPRESSOR) 25 MG tablet Take 25 mg by mouth 2 (two) times daily.    . nitroGLYCERIN (NITRODUR - DOSED IN MG/24 HR) 0.2 mg/hr Place 1 patch onto the skin daily.    . potassium chloride (KLOR-CON) 20 MEQ packet Take 20 mEq by mouth 2 (two) times daily.     . risperiDONE (RISPERDAL) 1 MG tablet Take 0.5 mg by mouth daily. 0.5mg  by mouth in the am and 2 tablets in the evening    . tamsulosin (FLOMAX) 0.4 MG CAPS capsule Take 0.4 mg by mouth daily.     . vitamin B-12 (CYANOCOBALAMIN) 1000 MCG tablet Take 1,000 mcg by mouth daily.      Marland Kitchen warfarin (COUMADIN) 2 MG tablet Take 2mg  daily except on Wednesdays and Saturdays take 4mg .  **Corrected Dosage** 40 tablet 0  . ipratropium-albuterol (DUONEB) 0.5-2.5 (3) MG/3ML SOLN Take 3 mLs by nebulization every 6 (six) hours as needed (congestion).    . Probiotic Product (PROBIOTIC DAILY PO) Take 1 capsule by mouth 2 (two) times daily.    Marland Kitchen senna (SENOKOT) 8.6 MG TABS Take 1 tablet  by mouth at bedtime.      No current facility-administered medications for this visit.    Past Medical History  Diagnosis Date  . Anemia   . Chronic constipation   . Bloating   . Colonic neoplasm   . Occult blood positive stool   . A-fib ([place])   . Hypertrophic cardiomyopathy ([place])   . Bipolar disorder ([place])   . Diastolic heart failure   . Pacemaker     placed and [facility] at [facility]  . Depression   . Chronic kidney disease   . Thrombocytosis ([place])   . CKD (chronic kidney disease) stage 3, GFR 30-59 ml/min 03/16/2013    Past Surgical History  Procedure Laterality Date  . Colonoscopy  10/02/2010    W/POLYP  . Fracture surgery    . Pacemaker insertion      [facility]  . Cholecystectomy      ROS:  Arthritis.  As stated in the HPI and negative for all other systems.  PHYSICAL EXAM BP  113/67 mmHg  Pulse 70  Ht 6\' 3"  (1.905 m)  Wt  PHYSICAL EXAM GEN:  No distress, frail NECK:  No jugular venous distention at 90 degrees, waveform within normal limits, carotid upstroke brisk and symmetric, no bruits, no thyromegaly LUNGS:  Clear to auscultation bilaterally BACK:  No CVA tenderness CHEST:  Unremarkable, healed pacemaker pocket.  HEART:  S1 and S2 within normal limits, no S3, no S4, no clicks, no rubs, 2/6 apical systolic murmur radiating slightly out the aortic outflow tract, no diastolic murmurs ABD:  Positive bowel sounds normal in frequency in pitch, no bruits, no rebound, no guarding, unable to assess midline mass or bruit with the patient seated. EXT:  2 plus pulses throughout, moderate edema, no cyanosis no clubbing  EKG:  Atrial fibrillation with ventricular pacing 100% capture, rate 70  03/14/2015  ASSESSMENT AND PLAN  ATRIAL FIBRILLATION:  The patient doesn't notice this rhythm. He has been anemic but has not had any evidence of active bleeding and he seemed to tolerate anticoagulation.  DIASTOLIC HF:  He seems to be euvolemic. No change in therapy is indicated.  CKD:  This was stable when last checked and I will defer to his primary providers.  PACEMAKER PLACEMENT:  It seems like he is due for pacemaker followup.  CHEST PAIN:  He does have some left upper chest discomfort.  I don't strongly suspect angina. I will start Imdur 30 mg daily. If he continues to have discomfort might suggest to Dr. Chriss Czar that he increase this to 60 mg as a therapeutic trial. If he has no change in symptoms the medication could be discontinued eventually. At this point unless he has worsening complaints and would not suggest further ischemia evaluation given his multiple medical problems and frail condition.  ANEMIA:  As above.

## 2015-03-15 ENCOUNTER — Telehealth: Payer: Self-pay | Admitting: Cardiology

## 2015-03-15 NOTE — Telephone Encounter (Signed)
KIM  NOTIFIED THAT  PT  NEEDS TO BE ON PATCH ONLY .Adonis Housekeeper

## 2015-03-15 NOTE — Telephone Encounter (Signed)
New Message  [place] calling form [facility] in [place]  Need to speak with RN about the risosoreide order

## 2015-03-15 NOTE — Telephone Encounter (Signed)
No.  Can be on the NTG patch only.

## 2015-03-15 NOTE — Telephone Encounter (Signed)
SPOKE WITH FACILITY  PT  HAS  NITRO  PATCH    DOES  HE  NEED TO BE  ON   ISOSORBIDE AS  WELL ./CY

## 2015-03-19 ENCOUNTER — Ambulatory Visit (INDEPENDENT_AMBULATORY_CARE_PROVIDER_SITE_OTHER): Payer: Medicare Other | Admitting: *Deleted

## 2015-03-19 DIAGNOSIS — I4891 Unspecified atrial fibrillation: Secondary | ICD-10-CM

## 2015-03-19 DIAGNOSIS — Z950 Presence of cardiac pacemaker: Secondary | ICD-10-CM | POA: Diagnosis not present

## 2015-03-19 LAB — CUP PACEART INCLINIC DEVICE CHECK
Battery Impedance: 1000 Ohm
Battery Voltage: 2.75 V
Brady Statistic RV Percent Paced: 98 %
Date Time Interrogation Session: 20161010115454
Lead Channel Impedance Value: 697 Ohm
Lead Channel Pacing Threshold Amplitude: 1.25 V
Lead Channel Setting Pacing Pulse Width: 0.5 ms
MDC IDC MSMT LEADCHNL RV PACING THRESHOLD PULSEWIDTH: 0.5 ms
MDC IDC SET LEADCHNL RV SENSING SENSITIVITY: 2 mV
Pulse Gen Serial Number: 1625320

## 2015-03-19 NOTE — Progress Notes (Signed)
Pacemaker check in clinic. Normal device function. Threshold, impedance consistent with previous measurements. Device programmed to maximize longevity. No high ventricular rates noted. Device programmed at appropriate safety margins. Histogram distribution appropriate for patient activity level- pt reports very little physical activity. Device programmed to optimize intrinsic conduction. Estimated longevity 8-9.25 years. ROV with GT in April.

## 2015-03-30 ENCOUNTER — Encounter: Payer: Self-pay | Admitting: Internal Medicine

## 2015-05-29 ENCOUNTER — Encounter (HOSPITAL_COMMUNITY): Payer: Self-pay | Admitting: Emergency Medicine

## 2015-05-29 ENCOUNTER — Inpatient Hospital Stay (HOSPITAL_COMMUNITY): Payer: Medicare Other

## 2015-05-29 ENCOUNTER — Emergency Department (HOSPITAL_COMMUNITY): Payer: Medicare Other

## 2015-05-29 ENCOUNTER — Inpatient Hospital Stay (HOSPITAL_COMMUNITY)
Admission: EM | Admit: 2015-05-29 | Discharge: 2015-06-01 | DRG: 389 | Disposition: A | Discharge status: Home / Self Care | Payer: Medicare Other | Attending: Family Medicine | Admitting: Family Medicine

## 2015-05-29 DIAGNOSIS — I5032 Chronic diastolic (congestive) heart failure: Secondary | ICD-10-CM | POA: Diagnosis present

## 2015-05-29 DIAGNOSIS — Z87891 Personal history of nicotine dependence: Secondary | ICD-10-CM | POA: Diagnosis not present

## 2015-05-29 DIAGNOSIS — E876 Hypokalemia: Secondary | ICD-10-CM | POA: Diagnosis present

## 2015-05-29 DIAGNOSIS — K5669 Other intestinal obstruction: Secondary | ICD-10-CM

## 2015-05-29 DIAGNOSIS — Z7901 Long term (current) use of anticoagulants: Secondary | ICD-10-CM | POA: Diagnosis not present

## 2015-05-29 DIAGNOSIS — I4891 Unspecified atrial fibrillation: Secondary | ICD-10-CM | POA: Diagnosis present

## 2015-05-29 DIAGNOSIS — I130 Hypertensive heart and chronic kidney disease with heart failure and stage 1 through stage 4 chronic kidney disease, or unspecified chronic kidney disease: Secondary | ICD-10-CM | POA: Diagnosis present

## 2015-05-29 DIAGNOSIS — I422 Other hypertrophic cardiomyopathy: Secondary | ICD-10-CM | POA: Diagnosis present

## 2015-05-29 DIAGNOSIS — Z8249 Family history of ischemic heart disease and other diseases of the circulatory system: Secondary | ICD-10-CM

## 2015-05-29 DIAGNOSIS — I482 Chronic atrial fibrillation: Secondary | ICD-10-CM | POA: Diagnosis not present

## 2015-05-29 DIAGNOSIS — N400 Enlarged prostate without lower urinary tract symptoms: Secondary | ICD-10-CM | POA: Diagnosis present

## 2015-05-29 DIAGNOSIS — E785 Hyperlipidemia, unspecified: Secondary | ICD-10-CM | POA: Diagnosis present

## 2015-05-29 DIAGNOSIS — N183 Chronic kidney disease, stage 3 unspecified: Secondary | ICD-10-CM | POA: Diagnosis present

## 2015-05-29 DIAGNOSIS — E860 Dehydration: Secondary | ICD-10-CM | POA: Diagnosis present

## 2015-05-29 DIAGNOSIS — Z950 Presence of cardiac pacemaker: Secondary | ICD-10-CM

## 2015-05-29 DIAGNOSIS — R7989 Other specified abnormal findings of blood chemistry: Secondary | ICD-10-CM | POA: Diagnosis not present

## 2015-05-29 DIAGNOSIS — I10 Essential (primary) hypertension: Secondary | ICD-10-CM | POA: Diagnosis not present

## 2015-05-29 DIAGNOSIS — F319 Bipolar disorder, unspecified: Secondary | ICD-10-CM | POA: Diagnosis present

## 2015-05-29 DIAGNOSIS — I708 Atherosclerosis of other arteries: Secondary | ICD-10-CM | POA: Diagnosis present

## 2015-05-29 DIAGNOSIS — I248 Other forms of acute ischemic heart disease: Secondary | ICD-10-CM | POA: Diagnosis present

## 2015-05-29 DIAGNOSIS — K219 Gastro-esophageal reflux disease without esophagitis: Secondary | ICD-10-CM | POA: Diagnosis present

## 2015-05-29 DIAGNOSIS — Z66 Do not resuscitate: Secondary | ICD-10-CM | POA: Diagnosis present

## 2015-05-29 DIAGNOSIS — R109 Unspecified abdominal pain: Secondary | ICD-10-CM

## 2015-05-29 DIAGNOSIS — Z85038 Personal history of other malignant neoplasm of large intestine: Secondary | ICD-10-CM | POA: Diagnosis not present

## 2015-05-29 DIAGNOSIS — K565 Intestinal adhesions [bands] with obstruction (postprocedural) (postinfection): Principal | ICD-10-CM | POA: Diagnosis present

## 2015-05-29 DIAGNOSIS — R1110 Vomiting, unspecified: Secondary | ICD-10-CM | POA: Diagnosis present

## 2015-05-29 DIAGNOSIS — K56609 Unspecified intestinal obstruction, unspecified as to partial versus complete obstruction: Secondary | ICD-10-CM | POA: Diagnosis present

## 2015-05-29 DIAGNOSIS — R509 Fever, unspecified: Secondary | ICD-10-CM

## 2015-05-29 DIAGNOSIS — D72829 Elevated white blood cell count, unspecified: Secondary | ICD-10-CM | POA: Diagnosis present

## 2015-05-29 HISTORY — DX: Bipolar disorder, unspecified: F31.9

## 2015-05-29 HISTORY — DX: Gastro-esophageal reflux disease without esophagitis: K21.9

## 2015-05-29 HISTORY — DX: Other forms of angina pectoris: I20.8

## 2015-05-29 HISTORY — DX: Essential (primary) hypertension: I10

## 2015-05-29 HISTORY — DX: Aphasia: R47.01

## 2015-05-29 HISTORY — DX: Insomnia, unspecified: G47.00

## 2015-05-29 HISTORY — DX: Hyperosmolality and hypernatremia: E87.0

## 2015-05-29 HISTORY — DX: Major depressive disorder, single episode, unspecified: F32.9

## 2015-05-29 HISTORY — DX: Dysphagia, unspecified: R13.10

## 2015-05-29 HISTORY — DX: Heart failure, unspecified: I50.9

## 2015-05-29 HISTORY — DX: Malignant (primary) neoplasm, unspecified: C80.1

## 2015-05-29 HISTORY — DX: Benign prostatic hyperplasia without lower urinary tract symptoms: N40.0

## 2015-05-29 HISTORY — DX: Other forms of angina pectoris: I20.89

## 2015-05-29 LAB — PROTIME-INR
INR: 3.67 — AB (ref 0.00–1.49)
PROTHROMBIN TIME: 35.6 s — AB (ref 11.6–15.2)

## 2015-05-29 LAB — CBC WITH DIFFERENTIAL/PLATELET
BASOS ABS: 0 10*3/uL (ref 0.0–0.1)
Basophils Relative: 0 %
EOS PCT: 0 %
Eosinophils Absolute: 0 10*3/uL (ref 0.0–0.7)
HEMATOCRIT: 35.8 % — AB (ref 39.0–52.0)
Hemoglobin: 11.5 g/dL — ABNORMAL LOW (ref 13.0–17.0)
LYMPHS PCT: 11 %
Lymphs Abs: 1.7 10*3/uL (ref 0.7–4.0)
MCH: 29.9 pg (ref 26.0–34.0)
MCHC: 32.1 g/dL (ref 30.0–36.0)
MCV: 93 fL (ref 78.0–100.0)
MONO ABS: 0.9 10*3/uL (ref 0.1–1.0)
MONOS PCT: 6 %
NEUTROS ABS: 12.4 10*3/uL — AB (ref 1.7–7.7)
Neutrophils Relative %: 83 %
PLATELETS: 481 10*3/uL — AB (ref 150–400)
RBC: 3.85 MIL/uL — ABNORMAL LOW (ref 4.22–5.81)
RDW: 18.6 % — AB (ref 11.5–15.5)
WBC: 15 10*3/uL — ABNORMAL HIGH (ref 4.0–10.5)

## 2015-05-29 LAB — COMPREHENSIVE METABOLIC PANEL
ALT: 26 U/L (ref 17–63)
ANION GAP: 11 (ref 5–15)
AST: 50 U/L — AB (ref 15–41)
Albumin: 3.8 g/dL (ref 3.5–5.0)
Alkaline Phosphatase: 33 U/L — ABNORMAL LOW (ref 38–126)
BILIRUBIN TOTAL: 1.4 mg/dL — AB (ref 0.3–1.2)
BUN: 30 mg/dL — AB (ref 6–20)
CHLORIDE: 89 mmol/L — AB (ref 101–111)
CO2: 40 mmol/L — ABNORMAL HIGH (ref 22–32)
Calcium: 9.8 mg/dL (ref 8.9–10.3)
Creatinine, Ser: 1.99 mg/dL — ABNORMAL HIGH (ref 0.61–1.24)
GFR, EST AFRICAN AMERICAN: 33 mL/min — AB (ref >60)
GFR, EST NON AFRICAN AMERICAN: 28 mL/min — AB (ref >60)
Glucose, Bld: 130 mg/dL — ABNORMAL HIGH (ref 65–99)
POTASSIUM: 3.2 mmol/L — AB (ref 3.5–5.1)
Sodium: 140 mmol/L (ref 135–145)
TOTAL PROTEIN: 6.9 g/dL (ref 6.5–8.1)

## 2015-05-29 LAB — LIPASE, BLOOD: LIPASE: 40 U/L (ref 11–51)

## 2015-05-29 LAB — TROPONIN I
TROPONIN I: 0.13 ng/mL — AB (ref <0.031)
TROPONIN I: 0.15 ng/mL — AB (ref <0.031)
Troponin I: 0.14 ng/mL — ABNORMAL HIGH (ref <0.031)

## 2015-05-29 MED ORDER — SODIUM CHLORIDE 0.9 % IV SOLN: Freq: Once | INTRAVENOUS | Status: DC

## 2015-05-29 MED ORDER — POTASSIUM CHLORIDE IN NACL 40-0.9 MEQ/L-% IV SOLN
INTRAVENOUS | Status: DC
  Administered 2015-05-29 – 2015-05-30 (×3): 100 mL/h via INTRAVENOUS

## 2015-05-29 MED ORDER — ACETAMINOPHEN 650 MG RE SUPP: 650.0000 mg | Freq: Four times a day (QID) | RECTAL | Status: DC | PRN

## 2015-05-29 MED ORDER — IPRATROPIUM-ALBUTEROL 0.5-2.5 (3) MG/3ML IN SOLN: 3.0000 mL | Freq: Four times a day (QID) | RESPIRATORY_TRACT | Status: DC | PRN

## 2015-05-29 MED ORDER — POTASSIUM CHLORIDE 10 MEQ/100ML IV SOLN
10.0000 meq | INTRAVENOUS | Status: AC
  Administered 2015-05-29 (×2): 10 meq via INTRAVENOUS
  Filled 2015-05-29: qty 100

## 2015-05-29 MED ORDER — HALOPERIDOL LACTATE 5 MG/ML IJ SOLN
5.0000 mg | Freq: Four times a day (QID) | INTRAMUSCULAR | Status: DC | PRN
  Administered 2015-05-30 – 2015-06-01 (×3): 5 mg via INTRAVENOUS
  Filled 2015-05-29 (×3): qty 1

## 2015-05-29 MED ORDER — SODIUM CHLORIDE 0.9 % IJ SOLN
3.0000 mL | Freq: Two times a day (BID) | INTRAMUSCULAR | Status: DC
  Administered 2015-05-31 – 2015-06-01 (×2): 3 mL via INTRAVENOUS

## 2015-05-29 MED ORDER — SODIUM CHLORIDE 0.9 % IV BOLUS (SEPSIS)
500.0000 mL | Freq: Once | INTRAVENOUS | Status: AC
  Administered 2015-05-29: 500 mL via INTRAVENOUS

## 2015-05-29 MED ORDER — SODIUM CHLORIDE 0.9 % IV SOLN
3.0000 g | Freq: Three times a day (TID) | INTRAVENOUS | Status: DC
  Administered 2015-05-29 – 2015-05-31 (×6): 3 g via INTRAVENOUS
  Filled 2015-05-29 (×10): qty 3

## 2015-05-29 MED ORDER — ACETAMINOPHEN 325 MG PO TABS: 650.0000 mg | ORAL_TABLET | Freq: Four times a day (QID) | ORAL | Status: DC | PRN

## 2015-05-29 MED ORDER — ONDANSETRON HCL 4 MG/2ML IJ SOLN
4.0000 mg | Freq: Once | INTRAMUSCULAR | Status: AC
  Administered 2015-05-29: 4 mg via INTRAVENOUS

## 2015-05-29 MED ORDER — ONDANSETRON HCL 4 MG PO TABS
4.0000 mg | ORAL_TABLET | Freq: Four times a day (QID) | ORAL | Status: DC | PRN
  Administered 2015-05-31: 4 mg via ORAL
  Filled 2015-05-29: qty 1

## 2015-05-29 MED ORDER — ONDANSETRON HCL 4 MG/2ML IJ SOLN
INTRAMUSCULAR | Status: AC
  Administered 2015-05-29: 4 mg via INTRAVENOUS
  Filled 2015-05-29: qty 2

## 2015-05-29 MED ORDER — PANTOPRAZOLE SODIUM 40 MG IV SOLR
40.0000 mg | INTRAVENOUS | Status: DC
  Administered 2015-05-29 – 2015-05-31 (×3): 40 mg via INTRAVENOUS
  Filled 2015-05-29 (×3): qty 40

## 2015-05-29 MED ORDER — ONDANSETRON HCL 4 MG/2ML IJ SOLN
4.0000 mg | Freq: Once | INTRAMUSCULAR | Status: AC
  Administered 2015-05-29: 4 mg via INTRAVENOUS
  Filled 2015-05-29: qty 2

## 2015-05-29 MED ORDER — ONDANSETRON HCL 4 MG/2ML IJ SOLN
4.0000 mg | Freq: Four times a day (QID) | INTRAMUSCULAR | Status: DC | PRN
  Administered 2015-05-30: 4 mg via INTRAVENOUS
  Filled 2015-05-29: qty 2

## 2015-05-29 NOTE — H&P (Signed)
[facility] History and Physical  Byrne Nankervis [id number] DOB: [birth year] DOA: 05/29/2015  Referring physician: Julianne Rice, MD PCP: Cyndee Brightly, MD   Chief Complaint: Abdominal pain   HPI: [name] is a 81 y.o. male with PMH of CKD Stage III, cardiac pacemarker, Afib and diastolic heart failure presented from [facility] presented with complaints of intermittent vomiting. The patient's symptoms started approximately 3 days ago. He's had persistent vomiting and generalized abdominal pain. His last bowel movement was approximately 2 days prior to admission. He says that he is unable to pass any flatus. He is noted to be mildly febrile on arrival. He denies any shortness of breath or chest pain. He has not had any significant cough. He was evaluated in the emergency room where CT imaging showed evidence of small bowel obstruction. Patient reports having appendix surgery in the past. He will be admitted to hospital for further evaluation.   Review of Systems:  Pertinent positives as per HPI, otherwise negative  Past Medical History  Diagnosis Date  . Anemia   . Chronic constipation   . Colonic neoplasm   . Occult blood positive stool   . A-fib ([place])   . Hypertrophic cardiomyopathy ([place])   . Bipolar disorder ([place])   . Diastolic heart failure   . Pacemaker     [facility] and [facility]  . Depression   . Chronic kidney disease   . Thrombocytosis ([place])   . CKD (chronic kidney disease) stage 3, GFR 30-59 ml/min 03/16/2013  . CHF (congestive heart failure) ([place])   . Aphasia   . Cancer (HCC)     squamous cell  . Bipolar 1 disorder ([place])   . MDD (major depressive disorder) ([place])   . Insomnia   . Hypertension   . BPH (benign prostatic hyperplasia)   . Dysphagia   . GERD (gastroesophageal reflux disease)   . Angina at rest [facility])   . Hyperosmolality and hypernatremia    Past Surgical History  Procedure Laterality Date  . Colonoscopy   10/02/2010    W/POLYP  . Fracture surgery    . Pacemaker insertion      [facility]   Social History:  reports that he quit smoking about 42 years ago. He has never used smokeless tobacco. He reports that he does not drink alcohol or use illicit drugs.  Allergies  Allergen Reactions  . Abilify [Aripiprazole]     Other reaction(s): Other (See Comments) Hallucinations  . Demerol     unknown  . Meperidine     Other reaction(s): Mental Status Changes (intolerance)  . Morphine Other (See Comments)    Hallucinations  . Morphine And Related     Hallucinations  . Risperdal [Risperidone] Other (See Comments)    Weakness, tiredness    Family History  Problem Relation Age of Onset  . Heart disease Mother   . Heart disease Sister   . Heart disease Brother     Prior to Admission medications   Medication Sig Start Date End Date Taking? Authorizing Provider  acetaminophen (TYLENOL) 650 MG CR tablet Take 650 mg by mouth 3 (three) times daily as needed for pain. TYLENOL ARTHRITIS    Historical Provider, MD  atorvastatin (LIPITOR) 40 MG tablet Take 1 tablet (40 mg total) by mouth daily at 6 PM. 03/15/13   Brittainy M Rosita Fire, PA-C  beta carotene w/minerals (OCUVITE) tablet Take 1 tablet by mouth daily.    Historical Provider, MD  Cholecalciferol (VITAMIN D3) 50000 UNITS  CAPS Take 50,000 Units by mouth every 30 (thirty) days.    Historical Provider, MD  esomeprazole (NEXIUM) 20 MG capsule Take 20 mg by mouth daily at 12 noon.    Historical Provider, MD  fenofibrate 160 MG tablet Take 1 tablet (160 mg total) by mouth daily. 01/06/13   Vernie Shanks, MD  ferrous sulfate 325 (65 FE) MG tablet Take 325 mg by mouth as directed. Take before meals, give with OJ    Historical Provider, MD  fexofenadine (ALLEGRA) 60 MG tablet Take 60 mg by mouth daily.    Historical Provider, MD  furosemide (LASIX) 20 MG tablet Take 80 mg by mouth 2 (two) times daily.     Historical Provider, MD  gabapentin (NEURONTIN) 100  MG capsule Take 100 mg by mouth 2 (two) times daily.     Historical Provider, MD  ipratropium-albuterol (DUONEB) 0.5-2.5 (3) MG/3ML SOLN Take 3 mLs by nebulization every 6 (six) hours as needed (congestion).    Historical Provider, MD  isosorbide mononitrate (IMDUR) 30 MG 24 hr tablet Take 1 tablet (30 mg total) by mouth daily. Patient not taking: Reported on 03/19/2015 03/14/15   Minus Breeding, MD  lisinopril (PRINIVIL,ZESTRIL) 2.5 MG tablet Take 2.5 mg by mouth daily.    Historical Provider, MD  metoprolol tartrate (LOPRESSOR) 25 MG tablet Take 25 mg by mouth 2 (two) times daily.    Historical Provider, MD  nitroGLYCERIN (NITRODUR - DOSED IN MG/24 HR) 0.2 mg/hr Place 1 patch onto the skin daily.    Historical Provider, MD  potassium chloride (KLOR-CON) 20 MEQ packet Take 20 mEq by mouth 2 (two) times daily.     Historical Provider, MD  Probiotic Product (PROBIOTIC DAILY PO) Take 1 capsule by mouth 2 (two) times daily.    Historical Provider, MD  risperiDONE (RISPERDAL) 1 MG tablet Take 0.5 mg by mouth daily. 0.5mg  by mouth in the am and 2 tablets in the evening    Historical Provider, MD  senna (SENOKOT) 8.6 MG TABS Take 1 tablet by mouth at bedtime.     Historical Provider, MD  tamsulosin (FLOMAX) 0.4 MG CAPS capsule Take 0.4 mg by mouth daily.     Historical Provider, MD  vitamin B-12 (CYANOCOBALAMIN) 1000 MCG tablet Take 1,000 mcg by mouth daily.      Historical Provider, MD  warfarin (COUMADIN) 2 MG tablet Take 2mg  daily except on Wednesdays and Saturdays take 4mg .  **Corrected Dosage** 02/28/13   Cherre Robins, PHARMD   Physical Exam: Filed Vitals:   05/29/15 1230 05/29/15 1300 05/29/15 1330 05/29/15 1416  BP: 109/60 128/67 107/63 101/65  Pulse: 68 70 70 69  Temp:   99.2 F (37.3 C) 100.2 F (37.9 C)  TempSrc:   Oral Oral  Resp: 18 21 20    Height:      Weight:      SpO2: 93% 100% 92% 96%    Wt Readings from Last 3 Encounters:  05/29/15 97.977 kg (216 lb)  09/12/14 88.361 kg  (194 lb 12.8 oz)  03/13/14 93.94 kg (207 lb 1.6 oz)    General:  Appears calm and comfortable Eyes: PERRL, normal lids, irises & conjunctiva ENT: grossly normal hearing, lips & tongue, NG tube is present Neck: no LAD, masses or thyromegaly Cardiovascular: Tachycardic, regular, no m/r/g. No LE edema. Telemetry: SR, no arrhythmias  Respiratory: CTA bilaterally, no w/r/r. Normal respiratory effort. Abdomen: soft, ntnd, bowel sounds are sluggish Skin: no rash or induration seen on limited exam Musculoskeletal: grossly  normal tone BUE/BLE Psychiatric: grossly normal mood and affect, speech fluent and appropriate Neurologic: grossly non-focal.          Labs on Admission:  Basic Metabolic Panel:  Recent Labs Lab 05/29/15 0954  NA 140  K 3.2*  CL 89*  CO2 40*  GLUCOSE 130*  BUN 30*  CREATININE 1.99*  CALCIUM 9.8   Liver Function Tests:  Recent Labs Lab 05/29/15 0954  AST 50*  ALT 26  ALKPHOS 33*  BILITOT 1.4*  PROT 6.9  ALBUMIN 3.8    Recent Labs Lab 05/29/15 0954  LIPASE 40   CBC:  Recent Labs Lab 05/29/15 0954  WBC 15.0*  NEUTROABS 12.4*  HGB 11.5*  HCT 35.8*  MCV 93.0  PLT 481*   Cardiac Enzymes:  Recent Labs Lab 05/29/15 0954  TROPONINI 0.13*    Radiological Exams on Admission: Ct Abdomen Pelvis Wo Contrast  05/29/2015  CLINICAL DATA:  Abdominal pain, distention and vomiting for 3 days. History of colon cancer. Initial encounter. EXAM: CT ABDOMEN AND PELVIS WITHOUT CONTRAST TECHNIQUE: Multidetector CT imaging of the abdomen and pelvis was performed following the standard protocol without IV contrast. COMPARISON:  None. FINDINGS: There are small bilateral pleural effusions, greater on the left, with some associated basilar atelectasis. Cardiomegaly is noted. No pericardial effusion. A few stones are seen in a decompressed gallbladder. There is no evidence of cholecystitis. A 1.0 cm in diameter hypoattenuating lesion is seen in the inferior right  hepatic lobe on image 29 with density units of 5.2 most consistent with a cyst. The liver is otherwise unremarkable. The spleen, adrenal glands, pancreas and biliary tree are unremarkable. The kidneys are atrophic bilaterally. Small left renal cyst is incidentally noted. The stomach is somewhat distended and there is mild distention of the proximal jejunum up to approximately 4.1 cm transition point appears to lie in the left upper quadrant the abdomen where small bowel loops appear adhesed together. No pneumatosis, portal venous gas or free intraperitoneal air is identified. A few sigmoid diverticula are noted. The colon is otherwise unremarkable. Aortoiliac atherosclerosis without aneurysm is identified. There is no lymphadenopathy or fluid collection. The prostate gland is mildly prominent. Urinary bladder wall is somewhat irregular with small diverticula noted. No focal bony abnormality is seen. IMPRESSION: Findings most consistent with proximal small bowel obstruction likely due to adhesions with a transition point in the left upper quadrant. Marked cardiomegaly. Very small bilateral pleural effusions, greater on the left. Gallstones without evidence of cholecystitis. Aortoiliac atherosclerosis. Mild sigmoid diverticulosis without diverticulitis. Electronically Signed   By: Inge Rise M.D.   On: 05/29/2015 11:43    EKG: Independently reviewed. Ventricular paced rhythm  Assessment/Plan Active Problems:   Atrial fibrillation (HCC)   Long term (current) use of anticoagulants   Bipolar disorder, unspecified (HCC)   CKD (chronic kidney disease) stage 3, GFR 30-59 ml/min   Small bowel obstruction (HCC)   Hypokalemia   Vomiting   DNR (do not resuscitate)   HTN (hypertension)   BPH (benign prostatic hyperplasia)   GERD (gastroesophageal reflux disease)   HLD (hyperlipidemia)   1. Small bowel obstruction. Likely related to adhesive disease. Patient has been placed with a NG tube for  decompression. We'll continue with bowel rest. Provide IV fluids. Patient has difficulty ambulating. Will request general surgery consultation to follow. Hopefully, obstruction will resolve with conservative management. 2. Afib, with long term anticoagulation use.  We'll hold further Coumadin for now. This can be reversed if surgical management is  needed more urgently. His cardiac rhythm is currently paced. 3. CKD Stage III. Creatinine appears to be near baseline. Continue to monitor with hydration. Would discontinue ACE inhibitor in the setting of chronic renal dysfunction. 4. Vomiting, secondary to SBO. Appears to have improved after NG tube placement. Continue antiemetics and nothing by mouth. 5. Essential HTN. Hold oral antihypertensives for now. Can use when necessary IV antihypertensives. Blood pressure is currently normotensive. 6. Hypokalemia, likely due to vomiting and Lasix. Will replace 7. BPH. Hold Flomax for now, will resume when able to take by mouth . 8. GERD continue on PPI 9. Elevated troponin. Patient does not have any significant chest pain or EKG changes. Likely myocardial strain in the setting of decrease in renal clearance. We'll continue to cycle troponins. 10. Bipolar disorder, unspecified. Change Risperdal to Haldol for now while he is nothing by mouth. 11. HLD, hold statin while he is nothing by mouth. 12. Chronic diastolic congestive heart failure. Appears dehydrated at this time. Hold off on Lasix for now. 13. Fever. Possibly related to #1. He may have also aspirated. Will check chest x-ray, urinalysis. Start empirically on Unasyn for aspiration for now.   Code Status: DNR DVT Prophylaxis: SCDs  Family Communication: discussed with daughter at the bedside Disposition Plan: discharge to [place] when improved  Time spent: 1 minutes   Kathie Dike, MD [facility] Pager [phone]

## 2015-05-29 NOTE — ED Notes (Signed)
NG tube discontinued from suction at time of transfer. Output remains 550. Pt tolerated well. nad noted.

## 2015-05-29 NOTE — ED Provider Notes (Signed)
CSN: [id number]     Arrival date & time 05/29/15  0935 History  By signing my name below, I, Tula Nakayama, attest that this documentation has been prepared under the direction and in the presence of Julianne Rice, MD.  Electronically Signed: Tula Nakayama, ED Scribe. 05/29/2015. 9:52 AM.  Chief Complaint  Patient presents with  . Abdominal Pain   The history is provided by the patient. No language interpreter was used.    HPI Comments: [name] is a 81 y.o. male who is a resident of [facility] and presents to the Emergency Department complaining of intermittent, moderate vomiting that started 3 days ago. He states nausea and generalized abdominal pain as associated symptoms. His pain does not improve with vomiting. Per nursing note, pt had 1 episode of emesis which was yellowish-brown in color en route to the ED.Pt's last BM was 2 days ago. He denies fever and diarrhea.  Past Medical History  Diagnosis Date  . Anemia   . Chronic constipation   . Colonic neoplasm   . Occult blood positive stool   . A-fib ([place])   . Hypertrophic cardiomyopathy ([place])   . Bipolar disorder ([place])   . Diastolic heart failure   . Pacemaker     placed and followed at [facility]  . Depression   . Chronic kidney disease   . Thrombocytosis ([place])   . CKD (chronic kidney disease) stage 3, GFR 30-59 ml/min 03/16/2013  . CHF (congestive heart failure) ([place])   . Aphasia   . Cancer (HCC)     squamous cell  . Bipolar 1 disorder ([place])   . MDD (major depressive disorder) ([place])   . Insomnia   . Hypertension   . BPH (benign prostatic hyperplasia)   . Dysphagia   . GERD (gastroesophageal reflux disease)   . Angina at rest [facility])   . Hyperosmolality and hypernatremia    Past Surgical History  Procedure Laterality Date  . Colonoscopy  10/02/2010    W/POLYP  . Fracture surgery    . Pacemaker insertion      [facility]  . Cholecystectomy     Family History  Problem Relation Age of Onset  .  Heart disease Mother   . Heart disease Sister   . Heart disease Brother    Social History  Substance Use Topics  . Smoking status: Former Smoker    Quit date: 10/05/1972  . Smokeless tobacco: Never Used  . Alcohol Use: No    Review of Systems  Constitutional: Negative for fever and chills.  Respiratory: Negative for shortness of breath.   Cardiovascular: Negative for chest pain and leg swelling.  Gastrointestinal: Positive for nausea, vomiting, abdominal pain and abdominal distention. Negative for diarrhea and constipation.  Genitourinary: Negative for flank pain.  Musculoskeletal: Negative for back pain.  Skin: Negative for rash.  Neurological: Negative for weakness, numbness and headaches.  All other systems reviewed and are negative.   Allergies  Abilify; Demerol; Meperidine; Morphine; Morphine and related; and Risperdal  Home Medications   Prior to Admission medications   Medication Sig Start Date End Date Taking? Authorizing Provider  acetaminophen (TYLENOL) 650 MG CR tablet Take 650 mg by mouth 3 (three) times daily as needed for pain. TYLENOL ARTHRITIS    Historical Provider, MD  atorvastatin (LIPITOR) 40 MG tablet Take 1 tablet (40 mg total) by mouth daily at 6 PM. 03/15/13   Brittainy Erie Noe, PA-C  beta carotene w/minerals (OCUVITE) tablet Take 1 tablet by mouth  daily.    Historical Provider, MD  Cholecalciferol (VITAMIN D3) 50000 UNITS CAPS Take 50,000 Units by mouth every 30 (thirty) days.    Historical Provider, MD  esomeprazole (NEXIUM) 20 MG capsule Take 20 mg by mouth daily at 12 noon.    Historical Provider, MD  fenofibrate 160 MG tablet Take 1 tablet (160 mg total) by mouth daily. 01/06/13   Vernie Shanks, MD  ferrous sulfate 325 (65 FE) MG tablet Take 325 mg by mouth as directed. Take before meals, give with OJ    Historical Provider, MD  fexofenadine (ALLEGRA) 60 MG tablet Take 60 mg by mouth daily.    Historical Provider, MD  furosemide (LASIX) 20 MG  tablet Take 80 mg by mouth 2 (two) times daily.     Historical Provider, MD  gabapentin (NEURONTIN) 100 MG capsule Take 100 mg by mouth 2 (two) times daily.     Historical Provider, MD  ipratropium-albuterol (DUONEB) 0.5-2.5 (3) MG/3ML SOLN Take 3 mLs by nebulization every 6 (six) hours as needed (congestion).    Historical Provider, MD  isosorbide mononitrate (IMDUR) 30 MG 24 hr tablet Take 1 tablet (30 mg total) by mouth daily. Patient not taking: Reported on 03/19/2015 03/14/15   Minus Breeding, MD  lisinopril (PRINIVIL,ZESTRIL) 2.5 MG tablet Take 2.5 mg by mouth daily.    Historical Provider, MD  metoprolol tartrate (LOPRESSOR) 25 MG tablet Take 25 mg by mouth 2 (two) times daily.    Historical Provider, MD  nitroGLYCERIN (NITRODUR - DOSED IN MG/24 HR) 0.2 mg/hr Place 1 patch onto the skin daily.    Historical Provider, MD  potassium chloride (KLOR-CON) 20 MEQ packet Take 20 mEq by mouth 2 (two) times daily.     Historical Provider, MD  Probiotic Product (PROBIOTIC DAILY PO) Take 1 capsule by mouth 2 (two) times daily.    Historical Provider, MD  risperiDONE (RISPERDAL) 1 MG tablet Take 0.5 mg by mouth daily. 0.5mg  by mouth in the am and 2 tablets in the evening    Historical Provider, MD  senna (SENOKOT) 8.6 MG TABS Take 1 tablet by mouth at bedtime.     Historical Provider, MD  tamsulosin (FLOMAX) 0.4 MG CAPS capsule Take 0.4 mg by mouth daily.     Historical Provider, MD  vitamin B-12 (CYANOCOBALAMIN) 1000 MCG tablet Take 1,000 mcg by mouth daily.      Historical Provider, MD  warfarin (COUMADIN) 2 MG tablet Take 2mg  daily except on Wednesdays and Saturdays take 4mg .  **Corrected Dosage** 02/28/13   [name] Eckard, PHARMD   BP 109/60 mmHg  Pulse 68  Temp(Src) 99.2 F (37.3 C) (Oral)  Resp 18  Ht 5\' 10"  (1.778 m)  Wt 216 lb (97.977 kg)  BMI 30.99 kg/m2  SpO2 93% Physical Exam  Constitutional: He is oriented to person, place, and time. He appears well-developed and well-nourished. No  distress.  HENT:  Head: Normocephalic and atraumatic.  Mouth/Throat: Oropharynx is clear and moist.  Yellow coating covering tongue  Eyes: EOM are normal. Pupils are equal, round, and reactive to light.  Neck: Normal range of motion. Neck supple.  Cardiovascular: Normal rate and regular rhythm.  Exam reveals no gallop and no friction rub.   No murmur heard. Pulmonary/Chest: Effort normal and breath sounds normal. No respiratory distress. He has no wheezes. He has no rales. He exhibits no tenderness.  Abdominal: Soft. He exhibits distension. He exhibits no mass. There is tenderness. There is no rebound and no guarding.  Patient has diffuse abdominal tenderness without focality. No rebound or guarding. Diminished bowel sounds throughout  Musculoskeletal: Normal range of motion. He exhibits no edema or tenderness.  No CVA tenderness. No lower extremity swelling or pain. Patient does have distended superficial veins of bilateral lower extremities. Distal pulses intact.  Neurological: He is alert and oriented to person, place, and time.  Skin: Skin is warm and dry. No rash noted. No erythema.  Psychiatric: He has a normal mood and affect. His behavior is normal.  Nursing note and vitals reviewed.   ED Course  Procedures  DIAGNOSTIC STUDIES: Oxygen Saturation is 95% on RA, adequate by my interpretation.    COORDINATION OF CARE: 9:52 AM Discussed treatment plan with pt. He agreed to plan.  Labs Review Labs Reviewed  CBC WITH DIFFERENTIAL/PLATELET - Abnormal; Notable for the following:    WBC 15.0 (*)    RBC 3.85 (*)    Hemoglobin 11.5 (*)    HCT 35.8 (*)    RDW 18.6 (*)    Platelets 481 (*)    Neutro Abs 12.4 (*)    All other components within normal limits  COMPREHENSIVE METABOLIC PANEL - Abnormal; Notable for the following:    Potassium 3.2 (*)    Chloride 89 (*)    CO2 40 (*)    Glucose, Bld 130 (*)    BUN 30 (*)    Creatinine, Ser 1.99 (*)    AST 50 (*)    Alkaline  Phosphatase 33 (*)    Total Bilirubin 1.4 (*)    GFR calc non Af Amer 28 (*)    GFR calc Af Amer 33 (*)    All other components within normal limits  PROTIME-INR - Abnormal; Notable for the following:    Prothrombin Time 35.6 (*)    INR 3.67 (*)    All other components within normal limits  TROPONIN I - Abnormal; Notable for the following:    Troponin I 0.13 (*)    All other components within normal limits  LIPASE, BLOOD  URINALYSIS, ROUTINE W REFLEX MICROSCOPIC (NOT AT [facility])    Imaging Review Ct Abdomen Pelvis Wo Contrast  05/29/2015  CLINICAL DATA:  Abdominal pain, distention and vomiting for 3 days. History of colon cancer. Initial encounter. EXAM: CT ABDOMEN AND PELVIS WITHOUT CONTRAST TECHNIQUE: Multidetector CT imaging of the abdomen and pelvis was performed following the standard protocol without IV contrast. COMPARISON:  None. FINDINGS: There are small bilateral pleural effusions, greater on the left, with some associated basilar atelectasis. Cardiomegaly is noted. No pericardial effusion. A few stones are seen in a decompressed gallbladder. There is no evidence of cholecystitis. A 1.0 cm in diameter hypoattenuating lesion is seen in the inferior right hepatic lobe on image 29 with density units of 5.2 most consistent with a cyst. The liver is otherwise unremarkable. The spleen, adrenal glands, pancreas and biliary tree are unremarkable. The kidneys are atrophic bilaterally. Small left renal cyst is incidentally noted. The stomach is somewhat distended and there is mild distention of the proximal jejunum up to approximately 4.1 cm transition point appears to lie in the left upper quadrant the abdomen where small bowel loops appear adhesed together. No pneumatosis, portal venous gas or free intraperitoneal air is identified. A few sigmoid diverticula are noted. The colon is otherwise unremarkable. Aortoiliac atherosclerosis without aneurysm is identified. There is no lymphadenopathy or  fluid collection. The prostate gland is mildly prominent. Urinary bladder wall is somewhat irregular with small diverticula noted.  No focal bony abnormality is seen. IMPRESSION: Findings most consistent with proximal small bowel obstruction likely due to adhesions with a transition point in the left upper quadrant. Marked cardiomegaly. Very small bilateral pleural effusions, greater on the left. Gallstones without evidence of cholecystitis. Aortoiliac atherosclerosis. Mild sigmoid diverticulosis without diverticulitis. Electronically Signed   By: Inge Rise M.D.   On: 05/29/2015 11:43   I have personally reviewed and evaluated these images and lab results as part of my medical decision-making.   EKG Interpretation   Date/Time:  Tuesday May 29 2015 09:46:10 EST Ventricular Rate:  70 PR Interval:  78 QRS Duration: 161 QT Interval:  512 QTC Calculation: 553 R Axis:   -81 Text Interpretation:  Atrial-sensed ventricular-paced rhythm No further  analysis attempted due to paced rhythm Confirmed by Lita Mains  MD, Jaimon Bugaj  ([id number]) on 05/29/2015 10:01:12 AM      MDM   Final diagnoses:  Small bowel obstruction ([place])    I personally performed the services described in this documentation, which was scribed in my presence. The recorded information has been reviewed and is accurate.   Patient with evidence of small bowel obstruction. We'll place NG tube in the emergency department and discuss with Dr. Roderic Palau who will admit patient. Patient has chronic renal insufficiency and mild elevation in troponin. Denies any chest pain. EKG without any ischemic findings.   Julianne Rice, MD 05/29/15 [phone]

## 2015-05-29 NOTE — ED Notes (Signed)
Attempted to call report, receiving RN in patient room. Will try again in a few minutes.

## 2015-05-29 NOTE — ED Notes (Signed)
Pt vomiting when trying to drink po contrast. EDP aware and reported to notify CT to complete CT with IV contrast only. CT aware and reported could complete scan once creatinine level back.

## 2015-05-29 NOTE — ED Notes (Signed)
Pt with abdominal pain, distention, vomiting for 3 days at [facility] where he resides. Patient vomiting x 1 with EMS, brown/yellow vomit per EMS. Patient is alert/oriented to baseline. Facility provider saw patient yesterday and IV was established with 0.45% normal saline infusing for hydration.

## 2015-05-29 NOTE — ED Notes (Signed)
Pt oxygen saturation decreased to 83% with vomiting. Pt placed on 2 liters nasal cannula and tolerating well. Oxygen saturation 93%.

## 2015-05-29 NOTE — ED Notes (Signed)
Pt room air saturation 94%. nad noted. Nasal cannula discontinued due to NG tube placement.

## 2015-05-30 LAB — CBC
HCT: 35.1 % — ABNORMAL LOW (ref 39.0–52.0)
HEMOGLOBIN: 11 g/dL — AB (ref 13.0–17.0)
MCH: 29.3 pg (ref 26.0–34.0)
MCHC: 31.3 g/dL (ref 30.0–36.0)
MCV: 93.6 fL (ref 78.0–100.0)
Platelets: 432 10*3/uL — ABNORMAL HIGH (ref 150–400)
RBC: 3.75 MIL/uL — AB (ref 4.22–5.81)
RDW: 18.3 % — ABNORMAL HIGH (ref 11.5–15.5)
WBC: 13.3 10*3/uL — ABNORMAL HIGH (ref 4.0–10.5)

## 2015-05-30 LAB — COMPREHENSIVE METABOLIC PANEL
ALK PHOS: 31 U/L — AB (ref 38–126)
ALT: 27 U/L (ref 17–63)
ANION GAP: 14 (ref 5–15)
AST: 47 U/L — ABNORMAL HIGH (ref 15–41)
Albumin: 3.4 g/dL — ABNORMAL LOW (ref 3.5–5.0)
BILIRUBIN TOTAL: 1.4 mg/dL — AB (ref 0.3–1.2)
BUN: 33 mg/dL — ABNORMAL HIGH (ref 6–20)
CALCIUM: 9.2 mg/dL (ref 8.9–10.3)
CO2: 33 mmol/L — ABNORMAL HIGH (ref 22–32)
CREATININE: 2.07 mg/dL — AB (ref 0.61–1.24)
Chloride: 95 mmol/L — ABNORMAL LOW (ref 101–111)
GFR, EST AFRICAN AMERICAN: 31 mL/min — AB (ref >60)
GFR, EST NON AFRICAN AMERICAN: 27 mL/min — AB (ref >60)
Glucose, Bld: 119 mg/dL — ABNORMAL HIGH (ref 65–99)
Potassium: 3.5 mmol/L (ref 3.5–5.1)
Sodium: 142 mmol/L (ref 135–145)
TOTAL PROTEIN: 6.4 g/dL — AB (ref 6.5–8.1)

## 2015-05-30 LAB — TROPONIN I: Troponin I: 0.15 ng/mL — ABNORMAL HIGH (ref <0.031)

## 2015-05-30 LAB — PROTIME-INR
INR: 3.44 — ABNORMAL HIGH (ref 0.00–1.49)
PROTHROMBIN TIME: 33.9 s — AB (ref 11.6–15.2)

## 2015-05-30 LAB — MAGNESIUM: MAGNESIUM: 2.2 mg/dL (ref 1.7–2.4)

## 2015-05-30 MED ORDER — CETYLPYRIDINIUM CHLORIDE 0.05 % MT LIQD
7.0000 mL | Freq: Two times a day (BID) | OROMUCOSAL | Status: DC
  Administered 2015-05-30 – 2015-06-01 (×4): 7 mL via OROMUCOSAL

## 2015-05-30 MED ORDER — DIPHENHYDRAMINE HCL 50 MG/ML IJ SOLN
12.5000 mg | Freq: Four times a day (QID) | INTRAMUSCULAR | Status: DC | PRN
  Administered 2015-05-30: 12.5 mg via INTRAVENOUS
  Filled 2015-05-30: qty 1

## 2015-05-30 MED ORDER — CHLORHEXIDINE GLUCONATE 0.12 % MT SOLN
15.0000 mL | Freq: Two times a day (BID) | OROMUCOSAL | Status: DC
  Administered 2015-05-30 – 2015-06-01 (×5): 15 mL via OROMUCOSAL
  Filled 2015-05-30 (×5): qty 15

## 2015-05-30 NOTE — Consult Note (Signed)
Reason for Consult: Small bowel obstruction Referring Physician: Hospitalist  [name] is an 81 y.o. male.  HPI: Patient is an 81 year old nursing home patient with multiple medical problems including chronic atrial fibrillation, bipolar disorder, cardiomyopathy, pacemaker placement, and chronic kidney disease who presents with nausea and vomiting. CT scan of the abdomen revealed a partial bowel obstruction possibly secondary to adhesive disease left upper abdomen. He has only had his appendix removed in the remote past. Is difficult to get a full history from the patient given his mental status. Denies abdominal pain and NG tube placement no apparent aspirate is present..  Past Medical History  Diagnosis Date  . Anemia   . Chronic constipation   . Colonic neoplasm   . Occult blood positive stool   . A-fib ([place])   . Hypertrophic cardiomyopathy ([place])   . Bipolar disorder ([place])   . Diastolic heart failure   . Pacemaker     placed and [facility] at [facility]  . Depression   . Chronic kidney disease   . Thrombocytosis ([place])   . CKD (chronic kidney disease) stage 3, GFR 30-59 ml/min 03/16/2013  . CHF (congestive heart failure) ([place])   . Aphasia   . Cancer (HCC)     squamous cell  . Bipolar 1 disorder ([place])   . MDD (major depressive disorder) ([place])   . Insomnia   . Hypertension   . BPH (benign prostatic hyperplasia)   . Dysphagia   . GERD (gastroesophageal reflux disease)   . Angina at rest [facility])   . Hyperosmolality and hypernatremia     Past Surgical History  Procedure Laterality Date  . Colonoscopy  10/02/2010    W/POLYP  . Fracture surgery    . Pacemaker insertion      [facility]    Family History  Problem Relation Age of Onset  . Heart disease Mother   . Heart disease Sister   . Heart disease Brother     Social History:  reports that he quit smoking about 42 years ago. He has never used smokeless tobacco. He reports that he does not drink alcohol or use illicit  drugs.  Allergies:  Allergies  Allergen Reactions  . Abilify [Aripiprazole]     Other reaction(s): Other (See Comments) Hallucinations  . Demerol     unknown  . Meperidine     Other reaction(s): Mental Status Changes (intolerance)  . Morphine Other (See Comments)    Hallucinations  . Morphine And Related     Hallucinations  . Risperdal [Risperidone] Other (See Comments)    Weakness, tiredness    Medications: I have reviewed the patient's current medications.  Results for orders placed or performed during the hospital encounter of 05/29/15 (from the past 48 hour(s))  CBC with Differential/Platelet     Status: Abnormal   Collection Time: 05/29/15  9:54 AM  Result Value Ref Range   WBC 15.0 (H) 4.0 - 10.5 K/uL   RBC 3.85 (L) 4.22 - 5.81 MIL/uL   Hemoglobin 11.5 (L) 13.0 - 17.0 g/dL   HCT 35.8 (L) 39.0 - 52.0 %   MCV 93.0 78.0 - 100.0 fL   MCH 29.9 26.0 - 34.0 pg   MCHC 32.1 30.0 - 36.0 g/dL   RDW 18.6 (H) 11.5 - 15.5 %   Platelets 481 (H) 150 - 400 K/uL   Neutrophils Relative % 83 %   Neutro Abs 12.4 (H) 1.7 - 7.7 K/uL   Lymphocytes Relative 11 %   Lymphs Abs 1.7  0.7 - 4.0 K/uL   Monocytes Relative 6 %   Monocytes Absolute 0.9 0.1 - 1.0 K/uL   Eosinophils Relative 0 %   Eosinophils Absolute 0.0 0.0 - 0.7 K/uL   Basophils Relative 0 %   Basophils Absolute 0.0 0.0 - 0.1 K/uL  Comprehensive metabolic panel     Status: Abnormal   Collection Time: 05/29/15  9:54 AM  Result Value Ref Range   Sodium 140 135 - 145 mmol/L   Potassium 3.2 (L) 3.5 - 5.1 mmol/L   Chloride 89 (L) 101 - 111 mmol/L   CO2 40 (H) 22 - 32 mmol/L   Glucose, Bld 130 (H) 65 - 99 mg/dL   BUN 30 (H) 6 - 20 mg/dL   Creatinine, Ser 1.99 (H) 0.61 - 1.24 mg/dL   Calcium 9.8 8.9 - 10.3 mg/dL   Total Protein 6.9 6.5 - 8.1 g/dL   Albumin 3.8 3.5 - 5.0 g/dL   AST 50 (H) 15 - 41 U/L   ALT 26 17 - 63 U/L   Alkaline Phosphatase 33 (L) 38 - 126 U/L   Total Bilirubin 1.4 (H) 0.3 - 1.2 mg/dL   GFR calc non Af  Amer 28 (L) >60 mL/min   GFR calc Af Amer 33 (L) >60 mL/min    Comment: (NOTE) The eGFR has been calculated using the CKD EPI equation. This calculation has not been validated in all clinical situations. eGFR's persistently <60 mL/min signify possible Chronic Kidney Disease.    Anion gap 11 5 - 15  Lipase, blood     Status: None   Collection Time: 05/29/15  9:54 AM  Result Value Ref Range   Lipase 40 11 - 51 U/L  Protime-INR     Status: Abnormal   Collection Time: 05/29/15  9:54 AM  Result Value Ref Range   Prothrombin Time 35.6 (H) 11.6 - 15.2 seconds   INR 3.67 (H) 0.00 - 1.49  Troponin I     Status: Abnormal   Collection Time: 05/29/15  9:54 AM  Result Value Ref Range   Troponin I 0.13 (H) <0.031 ng/mL    Comment:        PERSISTENTLY INCREASED TROPONIN VALUES IN THE RANGE OF 0.04-0.49 ng/mL CAN BE SEEN IN:       -UNSTABLE ANGINA       -CONGESTIVE HEART FAILURE       -MYOCARDITIS       -CHEST TRAUMA       -ARRYHTHMIAS       -LATE PRESENTING MYOCARDIAL INFARCTION       -COPD   CLINICAL FOLLOW-UP RECOMMENDED.   Troponin I     Status: Abnormal   Collection Time: 05/29/15  5:02 PM  Result Value Ref Range   Troponin I 0.14 (H) <0.031 ng/mL    Comment:        PERSISTENTLY INCREASED TROPONIN VALUES IN THE RANGE OF 0.04-0.49 ng/mL CAN BE SEEN IN:       -UNSTABLE ANGINA       -CONGESTIVE HEART FAILURE       -MYOCARDITIS       -CHEST TRAUMA       -ARRYHTHMIAS       -LATE PRESENTING MYOCARDIAL INFARCTION       -COPD   CLINICAL FOLLOW-UP RECOMMENDED.   Troponin I     Status: Abnormal   Collection Time: 05/29/15 10:42 PM  Result Value Ref Range   Troponin I 0.15 (H) <0.031 ng/mL    Comment:  PERSISTENTLY INCREASED TROPONIN VALUES IN THE RANGE OF 0.04-0.49 ng/mL CAN BE SEEN IN:       -UNSTABLE ANGINA       -CONGESTIVE HEART FAILURE       -MYOCARDITIS       -CHEST TRAUMA       -ARRYHTHMIAS       -LATE PRESENTING MYOCARDIAL INFARCTION       -COPD   CLINICAL  FOLLOW-UP RECOMMENDED.   Troponin I     Status: Abnormal   Collection Time: 05/30/15  4:10 AM  Result Value Ref Range   Troponin I 0.15 (H) <0.031 ng/mL    Comment:        PERSISTENTLY INCREASED TROPONIN VALUES IN THE RANGE OF 0.04-0.49 ng/mL CAN BE SEEN IN:       -UNSTABLE ANGINA       -CONGESTIVE HEART FAILURE       -MYOCARDITIS       -CHEST TRAUMA       -ARRYHTHMIAS       -LATE PRESENTING MYOCARDIAL INFARCTION       -COPD   CLINICAL FOLLOW-UP RECOMMENDED.   Protime-INR     Status: Abnormal   Collection Time: 05/30/15  4:10 AM  Result Value Ref Range   Prothrombin Time 33.9 (H) 11.6 - 15.2 seconds   INR 3.44 (H) 0.00 - 1.49  CBC     Status: Abnormal   Collection Time: 05/30/15  4:10 AM  Result Value Ref Range   WBC 13.3 (H) 4.0 - 10.5 K/uL   RBC 3.75 (L) 4.22 - 5.81 MIL/uL   Hemoglobin 11.0 (L) 13.0 - 17.0 g/dL   HCT 35.1 (L) 39.0 - 52.0 %   MCV 93.6 78.0 - 100.0 fL   MCH 29.3 26.0 - 34.0 pg   MCHC 31.3 30.0 - 36.0 g/dL   RDW 18.3 (H) 11.5 - 15.5 %   Platelets 432 (H) 150 - 400 K/uL  Comprehensive metabolic panel     Status: Abnormal   Collection Time: 05/30/15  4:10 AM  Result Value Ref Range   Sodium 142 135 - 145 mmol/L   Potassium 3.5 3.5 - 5.1 mmol/L   Chloride 95 (L) 101 - 111 mmol/L   CO2 33 (H) 22 - 32 mmol/L   Glucose, Bld 119 (H) 65 - 99 mg/dL   BUN 33 (H) 6 - 20 mg/dL   Creatinine, Ser 2.07 (H) 0.61 - 1.24 mg/dL   Calcium 9.2 8.9 - 10.3 mg/dL   Total Protein 6.4 (L) 6.5 - 8.1 g/dL   Albumin 3.4 (L) 3.5 - 5.0 g/dL   AST 47 (H) 15 - 41 U/L   ALT 27 17 - 63 U/L   Alkaline Phosphatase 31 (L) 38 - 126 U/L   Total Bilirubin 1.4 (H) 0.3 - 1.2 mg/dL   GFR calc non Af Amer 27 (L) >60 mL/min   GFR calc Af Amer 31 (L) >60 mL/min    Comment: (NOTE) The eGFR has been calculated using the CKD EPI equation. This calculation has not been validated in all clinical situations. eGFR's persistently <60 mL/min signify possible Chronic Kidney Disease.    Anion gap  14 5 - 15  Magnesium     Status: None   Collection Time: 05/30/15  4:10 AM  Result Value Ref Range   Magnesium 2.2 1.7 - 2.4 mg/dL    Ct Abdomen Pelvis Wo Contrast  05/29/2015  CLINICAL DATA:  Abdominal pain, distention and vomiting for 3 days. History of  colon cancer. Initial encounter. EXAM: CT ABDOMEN AND PELVIS WITHOUT CONTRAST TECHNIQUE: Multidetector CT imaging of the abdomen and pelvis was performed following the standard protocol without IV contrast. COMPARISON:  None. FINDINGS: There are small bilateral pleural effusions, greater on the left, with some associated basilar atelectasis. Cardiomegaly is noted. No pericardial effusion. A few stones are seen in a decompressed gallbladder. There is no evidence of cholecystitis. A 1.0 cm in diameter hypoattenuating lesion is seen in the inferior right hepatic lobe on image 29 with density units of 5.2 most consistent with a cyst. The liver is otherwise unremarkable. The spleen, adrenal glands, pancreas and biliary tree are unremarkable. The kidneys are atrophic bilaterally. Small left renal cyst is incidentally noted. The stomach is somewhat distended and there is mild distention of the proximal jejunum up to approximately 4.1 cm transition point appears to lie in the left upper quadrant the abdomen where small bowel loops appear adhesed together. No pneumatosis, portal venous gas or free intraperitoneal air is identified. A few sigmoid diverticula are noted. The colon is otherwise unremarkable. Aortoiliac atherosclerosis without aneurysm is identified. There is no lymphadenopathy or fluid collection. The prostate gland is mildly prominent. Urinary bladder wall is somewhat irregular with small diverticula noted. No focal bony abnormality is seen. IMPRESSION: Findings most consistent with proximal small bowel obstruction likely due to adhesions with a transition point in the left upper quadrant. Marked cardiomegaly. Very small bilateral pleural effusions,  greater on the left. Gallstones without evidence of cholecystitis. Aortoiliac atherosclerosis. Mild sigmoid diverticulosis without diverticulitis. Electronically Signed   By: Inge Rise M.D.   On: 05/29/2015 11:43   Portable Chest 1 View  05/29/2015  CLINICAL DATA:  Fevers and vomiting EXAM: PORTABLE CHEST - 1 VIEW COMPARISON:  11/16/2014 FINDINGS: Cardiac shadow is enlarged but stable. Vascular congestion and mild interstitial edema is noted. A pacing device is seen. A nasogastric catheter is seen extending into the stomach. No bony abnormality is noted. IMPRESSION: Mild vascular congestion and interstitial edema. No focal infiltrate is seen. Electronically Signed   By: Inez Catalina M.D.   On: 05/29/2015 17:10    ROS: See chart  Blood pressure 118/70, pulse 74, temperature 98.8 F (37.1 C), temperature source Oral, resp. rate 20, height _0  (1.778 m), weight 97.977 kg (216 lb), SpO2 96 %. Physical Exam: Pleasant white male in no acute distress.  Abdomen soft, nontender, nondistended. Occasional bowel sounds appreciated. Rectal examination was deferred at this time. No hernias are present.  Assessment/Plan: Impression: Partial small bowel obstruction. Difficult whether to a certain if this is mechanical versus ileus from another etiology. No need for acute surgical intervention at this time. Patient is a high risk surgical candidate given his multiple medical problems. Will continue NG tube decompression for now. Will reevaluate in a.m.  Criss Bartles A 05/30/2015, 1:02 PM

## 2015-05-30 NOTE — Clinical Social Work Note (Signed)
Clinical Social Work Assessment  Patient Details  Name: [name] MRN: [id number] Date of Birth: Jan 26, 1925  Date of referral:  05/30/15               Reason for consult:  Discharge Planning                Permission sought to share information with:  Family Supports Permission granted to share information::  Yes, Verbal Permission Granted  Name::     Gwynneth Albright::     Relationship::  daughter  Contact Information:     Housing/Transportation Living arrangements for the past 2 months:  [place] of Information:  Patient, Adult Children Patient Interpreter Needed:  None Criminal Activity/Legal Involvement Pertinent to Current Situation/Hospitalization:  No - Comment as needed Significant Relationships:  Adult Children Lives with:  Facility Resident Do you feel safe going back to the place where you live?  Yes Need for family participation in patient care:  Yes (Comment)  Care giving concerns:  Pt is long term resident at [facility].    Social Worker assessment / plan:  CSW met with pt at bedside. Pt alert, but oriented to self and place only. He states that he is feeling awful this morning and requests that CSW speak with his daughter, [name]. [name] states that pt has been a resident at [facility] since July 2015. She lives nearby the facility and is able to visit often. Pt admitted due to SBO. CSW spoke with Claiborne Billings at facility who reports pt is nursing level of care and okay to return. At baseline, pt is able to feed himself and requires 1 person assist for transfer to wheelchair.   Employment status:  Retired Nurse, adult PT Recommendations:  Not assessed at this time Information / Referral to community resources:  Other (Comment Required) (return to [facility])  Patient/Family's Response to care:  Pt's daughter requests return to [facility] when medically stable.   Patient/Family's Understanding of and Emotional  Response to Diagnosis, Current Treatment, and Prognosis:  Pt's daughter appears to be knowledgeable about pt's medical history. She is frustrated that pt will have to move rooms upon return to [facility] due to hospital stay, but states they cannot afford bed hold.   Emotional Assessment Appearance:  Appears stated age Attitude/Demeanor/Rapport:  Unable to Assess Affect (typically observed):  Unable to Assess Orientation:  Oriented to Self, Oriented to Place Alcohol / Substance use:  Not Applicable Psych involvement (Current and /or in the community):  No (Comment)  Discharge Needs  Concerns to be addressed:  Discharge Planning Concerns Readmission within the last 30 days:  No Current discharge risk:  None Barriers to Discharge:  Continued Medical Work up   Salome Arnt, [place] 05/30/2015, 9:21 AM [phone]

## 2015-05-30 NOTE — Care Management Note (Signed)
Case Management Note  Patient Details  Name: [name] MRN: [id number] Date of Birth: August 02, 1924  Subjective/Objective:                  Pt is from [facility] Pt admitted for SBO. CSW has been referred.   Action/Plan: Anticipate pt will return to SNF at [facility] is aware and will arrange for pt to return to facility. No CM needs anticipated.   Expected Discharge Date:    06/02/2015              Expected Discharge Plan:  Skilled Nursing Facility  In-House Referral:  Clinical Social Work  Discharge planning Services  CM Consult  Post Acute Care Choice:  NA Choice offered to:  NA  DME Arranged:    DME Agency:     HH Arranged:    [place] Agency:     Status of Service:  Completed, signed off  Medicare Important Message Given:    Date Medicare IM Given:    Medicare IM give by:    Date Additional Medicare IM Given:    Additional Medicare Important Message give by:     If discussed at [place] of Stay Meetings, dates discussed:    Additional Comments:  Sherald Barge, RN 05/30/2015, 11:15 AM

## 2015-05-30 NOTE — Progress Notes (Addendum)
PROGRESS NOTE  [name] [id number] DOB: Jun 02, 1925 DOA: 05/29/2015 PCP: Cyndee Brightly, MD  Summary: [name] is a 81 y.o. male with PMH of CKD Stage III, cardiac pacemarker, Afib and diastolic heart failure presented from [facility] with complaints of intermittent vomiting. He's had persistent vomiting and generalized abdominal pain. His last bowel movement was approximately 2 days prior to admission. He says that he is unable to pass any flatus. He is noted to be mildly febrile on arrival. He was evaluated in the emergency room where CT imaging showed evidence of small bowel obstruction. Patient reports having appendix surgery in the past. An NG tube was placed for decompression and general surgery has been consulted.   Assessment/Plan: 1. Small bowel obstruction, possibly related to adhesive disease. Nontoxic. Management per surgery. 2. Afib, with long term anticoagulation use. Coumadin on hold.   3. CKD Stage III. Possible AKI. Creatinine appears to be near baseline. Continue to monitor with hydration. ACE inhibitor discontinued in the setting of chronic renal dysfunction. 4. Vomiting, secondary to SBO. Resolved after NG tube placement.  5. Essential HTN, stable. Oral antihypertensives on hold, will use IV antihypertensives when necessary.  6. Hypokalemia, likely due to vomiting and Lasix. Replaced.  7. BPH. Flomax held, will resume when able to take by mouth . 8. GERD continue on PPI 9. Elevated troponin. No chest pain. EKG paced. Consider demand ischemia in the setting of decrease in renal clearance. No evidence of ACS. 10. Bipolar disorder, unspecified. Stable. Continue Haldol for now while he is nothing by mouth. 11. Chronic diastolic congestive heart failure. Appears dehydrated on admission so Lasix on hold. 12. Fever. Possibly related to #1.  Chest x-ray unremarkable. Urinalysis ordered. Continue Unasyn empirically.  19. PMH hypertrophic  cardiomyopathy, pacemaker, diastolic CHF, thrombocytosis, bipolar disorder. Warfarin on hold.  14. Aortoiliac atherosclerosis.   Discussed with Dr. Arnoldo Morale, plan conservative management.   Continue IV fluids, antiemetics and NPO  Code Status: DNR DVT prophylaxis: SCDs Family Communication: daugther at bedside Disposition Plan: pending  Christopher Hodgkins, MD  [facility]  Pager [phone] If 7PM-7AM, please contact night-coverage at [email or web address], password [facility] 05/30/2015, 8:08 AM  LOS: 1 day   Consultants:  General surgery   Procedures:    Antibiotics:  Unasyn 12/20 >>  HPI/Subjective: Ongoing abd pain, nausea, no vomiting. No chest pain. Some SOB. Some liquid bowel movements, passing flatus.  Objective: Filed Vitals:   05/29/15 1416 05/29/15 2221 05/29/15 2300 05/30/15 0451  BP: 101/65 110/62  118/70  Pulse: 69 71  74  Temp: 100.2 F (37.9 C) 99.1 F (37.3 C)  98.8 F (37.1 C)  TempSrc: Oral Oral  Oral  Resp:  20  20  Height:      Weight:      SpO2: 96% 95% 95% 96%    Intake/Output Summary (Last 24 hours) at 05/30/15 0808 Last data filed at 05/30/15 [id number]  Gross per 24 hour  Intake 1748.34 ml  Output   1050 ml  Net 698.34 ml     Filed Weights   05/29/15 0948  Weight: 97.977 kg (216 lb)    Exam:    VSS, afebrile, not hypoxic  General:  Appears calm and uncomfortable Cardiovascular: RRR, no m/r/g. No LE edema. Telemetry: paced rhythm Respiratory: CTA bilaterally, no w/r/r. Normal respiratory effort. Abdomen: soft, distended, positive BS Psychiatric: grossly normal mood and affect, speech fluent and appropriate  New data reviewed:  Potassium, improved at 3.5  Creatine slightly worse at 2.07, BUN  33  Troponins flat 0.15  WBC improved at 13.3  Hgb 11.0  INR 3.44  Scheduled Meds: . ampicillin-sulbactam (UNASYN) IV  3 g Intravenous Q8H  . antiseptic oral rinse  7 mL Mouth Rinse q12n4p  . chlorhexidine  15 mL Mouth Rinse BID  .  pantoprazole (PROTONIX) IV  40 mg Intravenous Q24H  . sodium chloride  3 mL Intravenous Q12H   Continuous Infusions: . 0.9 % NaCl with KCl 40 mEq / L 100 mL/hr (05/30/15 [id number])    Principal Problem:   Small bowel obstruction (HCC) Active Problems:   Atrial fibrillation ([place])   Long term (current) use of anticoagulants   Bipolar disorder, unspecified ([place])   CKD (chronic kidney disease) stage 3, GFR 30-59 ml/min   Hypokalemia   Vomiting   DNR (do not resuscitate)   HTN (hypertension)   BPH (benign prostatic hyperplasia)   GERD (gastroesophageal reflux disease)   HLD (hyperlipidemia)   Chronic diastolic congestive heart failure ([place])   Time spent 25 minutes   By signing my name below, I, Rosalie Doctor attest that this documentation has been prepared under the direction and in the presence of Christopher Hodgkins, MD Electronically signed: Rosalie Doctor, Scribe.  05/30/2015  I personally performed the services described in this documentation. All medical record entries made by the scribe were at my direction. I have reviewed the chart and agree that the record reflects my personal performance and is accurate and complete. Christopher Hodgkins, MD

## 2015-05-31 ENCOUNTER — Inpatient Hospital Stay (HOSPITAL_COMMUNITY): Payer: Medicare Other

## 2015-05-31 ENCOUNTER — Encounter (HOSPITAL_COMMUNITY): Payer: Self-pay | Admitting: Adult Health

## 2015-05-31 DIAGNOSIS — I10 Essential (primary) hypertension: Secondary | ICD-10-CM

## 2015-05-31 DIAGNOSIS — R7989 Other specified abnormal findings of blood chemistry: Secondary | ICD-10-CM

## 2015-05-31 LAB — CBC
HEMATOCRIT: 36.4 % — AB (ref 39.0–52.0)
Hemoglobin: 11 g/dL — ABNORMAL LOW (ref 13.0–17.0)
MCH: 28.9 pg (ref 26.0–34.0)
MCHC: 30.2 g/dL (ref 30.0–36.0)
MCV: 95.8 fL (ref 78.0–100.0)
PLATELETS: 429 10*3/uL — AB (ref 150–400)
RBC: 3.8 MIL/uL — AB (ref 4.22–5.81)
RDW: 18.9 % — AB (ref 11.5–15.5)
WBC: 13.7 10*3/uL — AB (ref 4.0–10.5)

## 2015-05-31 LAB — BASIC METABOLIC PANEL
ANION GAP: 12 (ref 5–15)
BUN: 32 mg/dL — AB (ref 6–20)
CHLORIDE: 103 mmol/L (ref 101–111)
CO2: 33 mmol/L — ABNORMAL HIGH (ref 22–32)
Calcium: 8.7 mg/dL — ABNORMAL LOW (ref 8.9–10.3)
Creatinine, Ser: 1.68 mg/dL — ABNORMAL HIGH (ref 0.61–1.24)
GFR, EST AFRICAN AMERICAN: 40 mL/min — AB (ref >60)
GFR, EST NON AFRICAN AMERICAN: 35 mL/min — AB (ref >60)
Glucose, Bld: 115 mg/dL — ABNORMAL HIGH (ref 65–99)
POTASSIUM: 3.7 mmol/L (ref 3.5–5.1)
SODIUM: 148 mmol/L — AB (ref 135–145)

## 2015-05-31 LAB — PROTIME-INR
INR: 4.14 — AB (ref 0.00–1.49)
PROTHROMBIN TIME: 39 s — AB (ref 11.6–15.2)

## 2015-05-31 LAB — MAGNESIUM: Magnesium: 2.4 mg/dL (ref 1.7–2.4)

## 2015-05-31 LAB — PHOSPHORUS: PHOSPHORUS: 2.5 mg/dL (ref 2.5–4.6)

## 2015-05-31 MED ORDER — METOPROLOL TARTRATE 25 MG PO TABS
12.5000 mg | ORAL_TABLET | Freq: Two times a day (BID) | ORAL | Status: DC
  Administered 2015-05-31 – 2015-06-01 (×3): 12.5 mg via ORAL
  Filled 2015-05-31 (×3): qty 1

## 2015-05-31 MED ORDER — KCL IN DEXTROSE-NACL 20-5-0.45 MEQ/L-%-% IV SOLN
INTRAVENOUS | Status: DC
  Administered 2015-05-31: 12:00:00 via INTRAVENOUS

## 2015-05-31 MED ORDER — WARFARIN - PHYSICIAN DOSING INPATIENT: Freq: Every day | Status: DC

## 2015-05-31 NOTE — Progress Notes (Signed)
Subjective: Patient denies any significant abdominal pain.  Objective: Vital signs in last 24 hours: Temp:  [99 F (37.2 C)-99.5 F (37.5 C)] 99 F (37.2 C) (12/22 0657) Pulse Rate:  [80-82] 80 (12/22 0657) Resp:  [20] 20 (12/22 0657) BP: (120-142)/(57-78) 120/57 mmHg (12/22 0657) SpO2:  [95 %-96 %] 96 % (12/22 0657) Last BM Date: 05/31/15  Intake/Output from previous day: 12/21 0701 - 12/22 0700 In: 700 [NG/GT:700] Out: -  Intake/Output this shift:    General appearance: alert and no distress GI: soft, non-tender; bowel sounds normal; no masses,  no organomegaly  Lab Results:   Recent Labs  05/30/15 0410 05/31/15 0606  WBC 13.3* 13.7*  HGB 11.0* 11.0*  HCT 35.1* 36.4*  PLT 432* 429*   BMET  Recent Labs  05/30/15 0410 05/31/15 0606  NA 142 148*  K 3.5 3.7  CL 95* 103  CO2 33* 33*  GLUCOSE 119* 115*  BUN 33* 32*  CREATININE 2.07* 1.68*  CALCIUM 9.2 8.7*   PT/INR  Recent Labs  05/30/15 0410 05/31/15 0606  LABPROT 33.9* 39.0*  INR 3.44* 4.14*    Studies/Results: Ct Abdomen Pelvis Wo Contrast  05/29/2015  CLINICAL DATA:  Abdominal pain, distention and vomiting for 3 days. History of colon cancer. Initial encounter. EXAM: CT ABDOMEN AND PELVIS WITHOUT CONTRAST TECHNIQUE: Multidetector CT imaging of the abdomen and pelvis was performed following the standard protocol without IV contrast. COMPARISON:  None. FINDINGS: There are small bilateral pleural effusions, greater on the left, with some associated basilar atelectasis. Cardiomegaly is noted. No pericardial effusion. Schmidt few stones are seen in Schmidt decompressed gallbladder. There is no evidence of cholecystitis. Schmidt 1.0 cm in diameter hypoattenuating lesion is seen in the inferior right hepatic lobe on image 29 with density units of 5.2 most consistent with Schmidt cyst. The liver is otherwise unremarkable. The spleen, adrenal glands, pancreas and biliary tree are unremarkable. The kidneys are atrophic bilaterally.  Small left renal cyst is incidentally noted. The stomach is somewhat distended and there is mild distention of the proximal jejunum up to approximately 4.1 cm transition point appears to lie in the left upper quadrant the abdomen where small bowel loops appear adhesed together. No pneumatosis, portal venous gas or free intraperitoneal air is identified. Schmidt few sigmoid diverticula are noted. The colon is otherwise unremarkable. Aortoiliac atherosclerosis without aneurysm is identified. There is no lymphadenopathy or fluid collection. The prostate gland is mildly prominent. Urinary bladder wall is somewhat irregular with small diverticula noted. No focal bony abnormality is seen. IMPRESSION: Findings most consistent with proximal small bowel obstruction likely due to adhesions with Schmidt transition point in the left upper quadrant. Marked cardiomegaly. Very small bilateral pleural effusions, greater on the left. Gallstones without evidence of cholecystitis. Aortoiliac atherosclerosis. Mild sigmoid diverticulosis without diverticulitis. Electronically Signed   By: Inge Rise M.D.   On: 05/29/2015 11:43   Portable Chest 1 View  05/29/2015  CLINICAL DATA:  Fevers and vomiting EXAM: PORTABLE CHEST - 1 VIEW COMPARISON:  11/16/2014 FINDINGS: Cardiac shadow is enlarged but stable. Vascular congestion and mild interstitial edema is noted. Schmidt pacing device is seen. Schmidt nasogastric catheter is seen extending into the stomach. No bony abnormality is noted. IMPRESSION: Mild vascular congestion and interstitial edema. No focal infiltrate is seen. Electronically Signed   By: Inez Catalina M.D.   On: 05/29/2015 17:10    Anti-infectives: Anti-infectives    Start     Dose/Rate Route Frequency Ordered Stop   05/29/15  1645  Ampicillin-Sulbactam (UNASYN) 3 g in sodium chloride 0.9 % 100 mL IVPB     3 g 100 mL/hr over 60 Minutes Intravenous Every 8 hours 05/29/15 1632        Assessment/Plan: Impression: Small bowel  obstruction, resolving Plan: Would DC NG tube and start clear liquid diet. No need for acute surgical intervention at this time.  LOS: 2 days    [name] 05/31/2015

## 2015-05-31 NOTE — Progress Notes (Signed)
PROGRESS NOTE  [name] [id number] DOB: [birth year] DOA: 05/29/2015 PCP: Cyndee Brightly, MD  Summary: 80 yom PMH of CKD Stage III, cardiac pacemarker, Afib and diastolic heart failure presented from [name] with vomiting. CT revealed SBO. NG tube was placed for decompression and general surgery has been consulted.   Assessment/Plan: 1. Small bowel obstruction, possibly related to adhesive disease. Appears to be resolving Management per surgery. 2. Afib, with long term anticoagulation use. Coumadin on hold. Resume when INR down. 3. CKD Stage III. Creatinine appears to at baseline.  4. Vomiting, secondary to SBO, resolved. NG tube removed. Will attempt liquid diet and advance as tolerated.  5. Essential HTN, stable. Oral antihypertensives on hold, will use IV antihypertensives when necessary.  6. Hypokalemia, likely due to vomiting and Lasix. Replaced.  7. GERD continue on PPI 8. Elevated troponin. No chest pain. EKG paced. Consider demand ischemia in the setting of decrease in renal clearance. No evidence of ACS. 9. Bipolar disorder, unspecified. Stable. Continue Haldol for now while he is nothing by mouth. 10. Chronic diastolic congestive heart failure.  11. Fever. Possibly related to #1. No recurrent fever. Chest x-ray unremarkable. Urinalysis ordered. D/c abx and monitor. 12. PMH hypertrophic cardiomyopathy, pacemaker, diastolic CHF, thrombocytosis, bipolar disorder. Warfarin on hold.  13. Aortoiliac atherosclerosis.   Overall improving, continue conservative management.   Cardiology recommendations perioperatively although it does appear that surgery can be avoided at this time.   D/c abx today  Home 1-2 days if continues to improve.  Code Status: DNR DVT prophylaxis: SCDs Family Communication: No family at bedside. Discussed with patient who understands and has no concerns at this time. Disposition Plan: Discharge when improved.   [name], MD  [facility]  Pager [phone] If 7PM-7AM, please contact night-coverage at [email or web address], password [facility] 05/31/2015, 6:42 AM  LOS: 2 days   Consultants:  General surgery   Procedures:  NG tube placed 12/20  Antibiotics:  Unasyn 12/20 >>  HPI/Subjective: Does not feel well. Has nausea and abdominal pain. CP this morning.   Objective: Filed Vitals:   05/30/15 1747 05/30/15 2000 05/30/15 2131 05/30/15 2244  BP: 127/78  142/63   Pulse: 82  80   Temp: 99.5 F (37.5 C)  99.1 F (37.3 C)   TempSrc: Oral  Oral   Resp: 20  20   Height:      Weight:      SpO2: 95% 95% 96% 95%    Intake/Output Summary (Last 24 hours) at 05/31/15 [id number] Last data filed at 05/30/15 1500  Gross per 24 hour  Intake    700 ml  Output      0 ml  Net    700 ml     Filed Weights   05/29/15 0948  Weight: 97.977 kg (216 lb)    Exam:    VSS, afebrile, not hypoxic  General:  Appears comfortable, calm. Cardiovascular: Irregular rhythm, regular rate, no murmur, rub or gallop. No lower extremity edema. Respiratory: Clear to auscultation bilaterally, no wheezes, rales or rhonchi. Normal respiratory effort. Abdomen: soft, ntnd, positive bowel sounds Psychiatric: grossly normal mood and affect, speech fluent and appropriate Neurologic: grossly non-focal.  New data reviewed:  WBC 13.7, Hgb 11.0, otherwise unremarkable  Sodium 148, BUN 32, Creatinine improved 1.68  Phosphorus and Mag wnl  INR 4.14    Scheduled Meds: . ampicillin-sulbactam (UNASYN) IV  3 g Intravenous Q8H  . antiseptic oral rinse  7 mL Mouth Rinse q12n4p  . chlorhexidine  15 mL Mouth Rinse BID  . pantoprazole (PROTONIX) IV  40 mg Intravenous Q24H  . sodium chloride  3 mL Intravenous Q12H   Continuous Infusions: . 0.9 % NaCl with KCl 40 mEq / L 100 mL/hr (05/30/15 1640)    Principal Problem:   Small bowel obstruction (HCC) Active Problems:   Atrial fibrillation (HCC)   Long term (current) use of  anticoagulants   Bipolar disorder, unspecified (HCC)   CKD (chronic kidney disease) stage 3, GFR 30-59 ml/min   Hypokalemia   Vomiting   DNR (do not resuscitate)   HTN (hypertension)   BPH (benign prostatic hyperplasia)   GERD (gastroesophageal reflux disease)   HLD (hyperlipidemia)   Chronic diastolic congestive heart failure ([place])   Time spent 20 minutes   By signing my name below, I, Rosalie Doctor attest that this documentation has been prepared under the direction and in the presence of [name], MD Electronically signed: Rosalie Doctor, Scribe.  05/31/2015 10:19am  I personally performed the services described in this documentation. All medical record entries made by the scribe were at my direction. I have reviewed the chart and agree that the record reflects my personal performance and is accurate and complete. [name], MD

## 2015-05-31 NOTE — Consult Note (Signed)
CARDIOLOGY CONSULT NOTE   Patient ID: [name] MRN: [id number] DOB/AGE: May 17, 1925 81 y.o.  Admit Date: 05/29/2015 Referring Physician: [facility] Primary Physician: Cyndee Brightly, MD Consulting Cardiologist: Harrington Challenger MD Primary Cardiologist: Percival Spanish MD Reason for Consultation: Atrial fib, PPM in situ  Clinical Summary [name] is a 81 y.o.male with history of hypertrophic CM, chronic diastolic CHF, hypertension, BiPolar disorder, with multiple psychiatric issues, CKD, thrombocytosis, atrial fib on coumadin, CHADS VASC Score of  2 (age and Hypertension).PPM,(St, Jude)  admitted for abdominal pain and was found to have a small bowel obstruction. We are consulted for peri-operative evaluation in case there is a need for surgery.   On arrival to ER, BP 118/52, HR 70 O2 Sat 95%, low grade fever of 99.2. Leukocytosis with WBC 15.0, Hgb 11.5, Hct 35.0. Potassium 3.2, creatinine of 1.99, Elevated LFTs AST 50, ALK Phos 33, Bili total 1.4, INR 3.67. PT 35.6 Troponin of 0.13. EKG on admission V-paced rhythm at 70 bpm. His daughter at bedside states that the [facility] has been following his INR. She also states that he had hematemesis when he first came in. NG tube was placed bur removed yesterday. He is being seen by surgery as well. They are not planning surgical intervention at this time.   Has been seen by Dr. Lovena Le for [facility] evaluation in April of 2016.   Allergies  Allergen Reactions  . Abilify [Aripiprazole]     Other reaction(s): Other (See Comments) Hallucinations  . Demerol     unknown  . Meperidine     Other reaction(s): Mental Status Changes (intolerance)  . Morphine Other (See Comments)    Hallucinations  . Morphine And Related     Hallucinations  . Risperdal [Risperidone] Other (See Comments)    Weakness, tiredness    Medications Scheduled Medications: . antiseptic oral rinse  7 mL Mouth Rinse q12n4p  . chlorhexidine  15 mL Mouth Rinse BID  . pantoprazole  (PROTONIX) IV  40 mg Intravenous Q24H  . sodium chloride  3 mL Intravenous Q12H  . Warfarin - Physician Dosing Inpatient   Does not apply q1800    Infusions: . dextrose 5 % and 0.45 % NaCl with KCl 20 mEq/L      PRN Medications: acetaminophen **OR** acetaminophen, diphenhydrAMINE, haloperidol lactate, ipratropium-albuterol, ondansetron **OR** ondansetron (ZOFRAN) IV   Past Medical History  Diagnosis Date  . Anemia   . Chronic constipation   . Colonic neoplasm   . Occult blood positive stool   . A-fib ([place])   . Hypertrophic cardiomyopathy ([place])   . Bipolar disorder ([place])   . Diastolic heart failure   . Pacemaker     placed and followed at [facility]  . Depression   . Chronic kidney disease   . Thrombocytosis ([place])   . CKD (chronic kidney disease) stage 3, GFR 30-59 ml/min 03/16/2013  . CHF (congestive heart failure) ([place])   . Aphasia   . Cancer (HCC)     squamous cell  . Bipolar 1 disorder ([place])   . MDD (major depressive disorder) ([place])   . Insomnia   . Hypertension   . BPH (benign prostatic hyperplasia)   . Dysphagia   . GERD (gastroesophageal reflux disease)   . Angina at rest [facility])   . Hyperosmolality and hypernatremia     Past Surgical History  Procedure Laterality Date  . Colonoscopy  10/02/2010    W/POLYP  . Fracture surgery    . Pacemaker insertion  [facility] VVI    Family History  Problem Relation Age of Onset  . Heart disease Mother   . Heart disease Sister   . Heart disease Brother     Social History [name] reports that he quit smoking about 42 years ago. He has never used smokeless tobacco. [name] reports that he does not drink alcohol.  Review of Systems Complete review of systems are found to be negative unless outlined in H&P above.  Physical Examination Blood pressure 120/57, pulse 80, temperature 99 F (37.2 C), temperature source Oral, resp. rate 20, height _0  (1.778 m), weight 216 lb (97.977 kg), SpO2 96  %.  Intake/Output Summary (Last 24 hours) at 05/31/15 1119 Last data filed at 05/30/15 1500  Gross per 24 hour  Intake    700 ml  Output      0 ml  Net    700 ml    Telemetry: Paced rhythm.   HRC:BULAG. Forgetful  HEENT: Conjunctiva and lids normal, oropharynx clear with moist mucosa. Neck: Supple, no elevated JVP or carotid bruits, no thyromegaly. Lungs: Clear to auscultation, nonlabored breathing at rest. Cardiac: Regular rate and rhythm, no S3 or significant systolic murmur, no pericardial rub. Abdomen: Soft, nontender, no hepatomegaly, bowel sounds present, no guarding or rebound. Extremities: No pitting edema, distal pulses 2+. Skin: Warm and dry. Musculoskeletal: No kyphosis. Neuropsychiatric: Alert and oriented x3, affect grossly appropriate. Poor memory.   Prior Cardiac Testing/Procedures 1.Echocardiogram 03/15/2015 Procedure narrative: Transthoracic echocardiography. Image quality was adequate. The study was technically difficult. - Left ventricle: There was moderate concentric hypertrophy. Systolic function was normal. The estimated ejection fraction was in the range of 55% to 60%. Although no diagnostic regional wall motion abnormality was identified, this possibility cannot be completely excluded on the basis of this study. - Ventricular septum: Septal motion showed paradox. These changes are consistent with right ventricular pacing. - Mitral valve: Calcified annulus. - Left atrium: The atrium was massively dilated. - Right atrium: The atrium was severely dilated. - Tricuspid valve: Moderate regurgitation. - Pulmonary arteries: Systolic pressure was mildly increased. PA peak pressure: 28m Hg (S).  NM Stress Test 03/16/2015 IMPRESSION: 1. Mild attenuation involving the anterior wall and left ventricular apex. No scintigraphic evidence of prior infarction or pharmacologically induced ischemia. 2. Mild hypokinesia involving the basilar  aspect of the left ventricular septum. Ejection fraction - 46%.   Lab Results  Basic Metabolic Panel:  Recent Labs Lab 05/29/15 0954 05/30/15 0410 05/31/15 0606  NA 140 142 148*  K 3.2* 3.5 3.7  CL 89* 95* 103  CO2 40* 33* 33*  GLUCOSE 130* 119* 115*  BUN 30* 33* 32*  CREATININE 1.99* 2.07* 1.68*  CALCIUM 9.8 9.2 8.7*  MG  --  2.2 2.4  PHOS  --   --  2.5    Liver Function Tests:  Recent Labs Lab 05/29/15 0954 05/30/15 0410  AST 50* 47*  ALT 26 27  ALKPHOS 33* 31*  BILITOT 1.4* 1.4*  PROT 6.9 6.4*  ALBUMIN 3.8 3.4*    CBC:  Recent Labs Lab 05/29/15 0954 05/30/15 0410 05/31/15 0606  WBC 15.0* 13.3* 13.7*  NEUTROABS 12.4*  --   --   HGB 11.5* 11.0* 11.0*  HCT 35.8* 35.1* 36.4*  MCV 93.0 93.6 95.8  PLT 481* 432* 429*    Cardiac Enzymes:  Recent Labs Lab 05/29/15 0954 05/29/15 1702 05/29/15 2242 05/30/15 0410  TROPONINI 0.13* 0.14* 0.15* 0.15*    BNP: Invalid input(s): POCBNP  Radiology: Ct Abdomen Pelvis Wo Contrast  05/29/2015  CLINICAL DATA:  Abdominal pain, distention and vomiting for 3 days. History of colon cancer. Initial encounter. EXAM: CT ABDOMEN AND PELVIS WITHOUT CONTRAST TECHNIQUE: Multidetector CT imaging of the abdomen and pelvis was performed following the standard protocol without IV contrast. COMPARISON:  None. FINDINGS: There are small bilateral pleural effusions, greater on the left, with some associated basilar atelectasis. Cardiomegaly is noted. No pericardial effusion. A few stones are seen in a decompressed gallbladder. There is no evidence of cholecystitis. A 1.0 cm in diameter hypoattenuating lesion is seen in the inferior right hepatic lobe on image 29 with density units of 5.2 most consistent with a cyst. The liver is otherwise unremarkable. The spleen, adrenal glands, pancreas and biliary tree are unremarkable. The kidneys are atrophic bilaterally. Small left renal cyst is incidentally noted. The stomach is somewhat  distended and there is mild distention of the proximal jejunum up to approximately 4.1 cm transition point appears to lie in the left upper quadrant the abdomen where small bowel loops appear adhesed together. No pneumatosis, portal venous gas or free intraperitoneal air is identified. A few sigmoid diverticula are noted. The colon is otherwise unremarkable. Aortoiliac atherosclerosis without aneurysm is identified. There is no lymphadenopathy or fluid collection. The prostate gland is mildly prominent. Urinary bladder wall is somewhat irregular with small diverticula noted. No focal bony abnormality is seen. IMPRESSION: Findings most consistent with proximal small bowel obstruction likely due to adhesions with a transition point in the left upper quadrant. Marked cardiomegaly. Very small bilateral pleural effusions, greater on the left. Gallstones without evidence of cholecystitis. Aortoiliac atherosclerosis. Mild sigmoid diverticulosis without diverticulitis. Electronically Signed   By: Inge Rise M.D.   On: 05/29/2015 11:43   Portable Chest 1 View  05/29/2015  CLINICAL DATA:  Fevers and vomiting EXAM: PORTABLE CHEST - 1 VIEW COMPARISON:  11/16/2014 FINDINGS: Cardiac shadow is enlarged but stable. Vascular congestion and mild interstitial edema is noted. A pacing device is seen. A nasogastric catheter is seen extending into the stomach. No bony abnormality is noted. IMPRESSION: Mild vascular congestion and interstitial edema. No focal infiltrate is seen. Electronically Signed   By: Inez Catalina M.D.   On: 05/29/2015 17:10     ECG: Atrial fib  Ventricular paced rhythm   Impression and Recommendations 1. Preop cardiac evaluation  Echo today shows  Pt with normal LV systolic function  Severe LVH.  No signif obstruction to outflow at rest.  Recent myoview with no ischemia  In atrial fib  Paced  From cardiac standpoint, he is an acceptable risk and ok to proceed with surgery if needed.  Would recomm  close follow up of volume   No plans for surgical intervention at this time  per Dr. Arnoldo Morale note.   2. Atrial fib: Heart rate is well controlled but INR is supratherapeutic. He is being followed by pharmacy for dosing. Since he is back on orals, would resume coumadin when INR is at safe level. Restart metoprolol at lower dose of 12.5 mg BID.   3. St. Jude Pacemaker in situ: Last interrogation completed in Oct of 2016. Normal device function. Threshold, impedance consistent with previous measurements. Device programmed to maximize longevity. No high ventricular rates noted. Device programmed at appropriate safety margins. Histogram distribution appropriate for patient activity level- pt reports very little physical activity  4. Hypokalemia: Potassium 3.7 this am with replacement  4. Chronic Diastolic CHF: No evidence of fluid overload or decompensation.  Signed: Phill Myron. Lawrence NP [place]  05/31/2015, 11:19 AM Co-Sign MD  Pt seen and examined  I have amended note above by Arnold Long to reflect my findings Wlll follow peripherally .   Dorris Carnes

## 2015-05-31 NOTE — Progress Notes (Signed)
  Echocardiogram 2D Echocardiogram has been performed.  Jennette Dubin 05/31/2015, 2:55 PM

## 2015-06-01 DIAGNOSIS — I482 Chronic atrial fibrillation: Secondary | ICD-10-CM

## 2015-06-01 LAB — CBC
HCT: 34.3 % — ABNORMAL LOW (ref 39.0–52.0)
HEMOGLOBIN: 10.4 g/dL — AB (ref 13.0–17.0)
MCH: 29.1 pg (ref 26.0–34.0)
MCHC: 30.3 g/dL (ref 30.0–36.0)
MCV: 96.1 fL (ref 78.0–100.0)
Platelets: 417 10*3/uL — ABNORMAL HIGH (ref 150–400)
RBC: 3.57 MIL/uL — AB (ref 4.22–5.81)
RDW: 18.8 % — ABNORMAL HIGH (ref 11.5–15.5)
WBC: 12.1 10*3/uL — AB (ref 4.0–10.5)

## 2015-06-01 LAB — URINE MICROSCOPIC-ADD ON
BACTERIA UA: NONE SEEN
RBC / HPF: NONE SEEN RBC/hpf (ref 0–5)
Squamous Epithelial / LPF: NONE SEEN

## 2015-06-01 LAB — PROTIME-INR
INR: 4.32 — ABNORMAL HIGH (ref 0.00–1.49)
Prothrombin Time: 40.2 seconds — ABNORMAL HIGH (ref 11.6–15.2)

## 2015-06-01 LAB — URINALYSIS, ROUTINE W REFLEX MICROSCOPIC
Bilirubin Urine: NEGATIVE
GLUCOSE, UA: NEGATIVE mg/dL
HGB URINE DIPSTICK: NEGATIVE
KETONES UR: NEGATIVE mg/dL
Nitrite: NEGATIVE
Specific Gravity, Urine: 1.015 (ref 1.005–1.030)
pH: 6 (ref 5.0–8.0)

## 2015-06-01 MED ORDER — WARFARIN - PHARMACIST DOSING INPATIENT: Status: DC

## 2015-06-01 MED ORDER — RISPERIDONE 0.5 MG PO TABS: 0.5000 mg | ORAL_TABLET | Freq: Every day | ORAL | Status: DC

## 2015-06-01 MED ORDER — KETOTIFEN FUMARATE 0.025 % OP SOLN
1.0000 [drp] | Freq: Two times a day (BID) | OPHTHALMIC | Status: DC
  Administered 2015-06-01: 1 [drp] via OPHTHALMIC
  Filled 2015-06-01: qty 5

## 2015-06-01 MED ORDER — SIMETHICONE 80 MG PO CHEW: 160.0000 mg | CHEWABLE_TABLET | Freq: Once | ORAL | Status: DC | PRN

## 2015-06-01 MED ORDER — ACETAMINOPHEN ER 650 MG PO TBCR: 650.0000 mg | EXTENDED_RELEASE_TABLET | Freq: Three times a day (TID) | ORAL | Status: DC | PRN

## 2015-06-01 MED ORDER — RISPERIDONE 0.5 MG PO TABS
0.5000 mg | ORAL_TABLET | Freq: Every day | ORAL | Status: DC
  Administered 2015-06-01: 0.5 mg via ORAL
  Filled 2015-06-01: qty 1

## 2015-06-01 MED ORDER — RISPERIDONE 1 MG PO TABS: 1.0000 mg | ORAL_TABLET | Freq: Every day | ORAL | Status: DC

## 2015-06-01 MED ORDER — NITROGLYCERIN 0.2 MG/HR TD PT24
0.2000 mg | MEDICATED_PATCH | Freq: Every day | TRANSDERMAL | Status: DC
  Administered 2015-06-01: 0.2 mg via TRANSDERMAL
  Filled 2015-06-01 (×4): qty 1

## 2015-06-01 MED ORDER — POLYETHYLENE GLYCOL 3350 17 G PO PACK: 17.0000 g | PACK | Freq: Every day | ORAL | Status: DC

## 2015-06-01 MED ORDER — GABAPENTIN 100 MG PO CAPS
100.0000 mg | ORAL_CAPSULE | Freq: Two times a day (BID) | ORAL | Status: DC
  Administered 2015-06-01: 100 mg via ORAL
  Filled 2015-06-01: qty 1

## 2015-06-01 MED ORDER — ATORVASTATIN CALCIUM 40 MG PO TABS
40.0000 mg | ORAL_TABLET | Freq: Every day | ORAL | Status: DC
Start: 2015-06-01 — End: 2015-06-01

## 2015-06-01 MED ORDER — HYDROXYUREA 500 MG PO CAPS
500.0000 mg | ORAL_CAPSULE | Freq: Every day | ORAL | Status: DC
  Administered 2015-06-01: 500 mg via ORAL
  Filled 2015-06-01: qty 1

## 2015-06-01 MED ORDER — TAMSULOSIN HCL 0.4 MG PO CAPS
0.4000 mg | ORAL_CAPSULE | Freq: Every day | ORAL | Status: DC
  Administered 2015-06-01: 0.4 mg via ORAL
  Filled 2015-06-01: qty 1

## 2015-06-01 MED ORDER — PANTOPRAZOLE SODIUM 40 MG PO TBEC
40.0000 mg | DELAYED_RELEASE_TABLET | Freq: Every day | ORAL | Status: DC
  Administered 2015-06-01: 40 mg via ORAL
  Filled 2015-06-01: qty 1

## 2015-06-01 NOTE — Care Management Important Message (Signed)
Important Message  Patient Details  Name: [name] MRN: [id number] Date of Birth: [birth year]   Medicare Important Message Given:  Yes    Sherald Barge, RN 06/01/2015, 1:36 PM

## 2015-06-01 NOTE — Progress Notes (Signed)
Patient with orders to be discharge back to [facility]. Report called to [facility], nurse. Patient stable upon transport. Patient transported via daughter.

## 2015-06-01 NOTE — Discharge Summary (Signed)
Physician Discharge Summary  [name] [id number] DOB: Nov 16, 1924 DOA: 05/29/2015  PCP: Cyndee Brightly, MD  Admit date: 05/29/2015 Discharge date: 06/01/2015  Recommendations for Outpatient Follow-up:  1. Warfarin on hold currently because INR high. Check INR daily, resume warfarin when INR below 3.  Lab Results  Component Value Date   INR 4.32* 06/01/2015   INR 4.14* 05/31/2015   INR 3.44* 05/30/2015      Follow-up Information    Follow up with Cyndee Brightly, MD.   Specialty:  Internal Medicine   Contact information:   [place] [address] [phone]       Discharge Diagnoses:  1. Small bowel obstruction, possibly related to adhesive disease. 2. Vomiting, secondary to SBO. 3. Afib  4. CKD Stage III. 5. Elevated troponin. 6. Essential HTN. 7. Hypokalemia 8. GERD  9. Chronic diastolic congestive heart failure.  10. Aortoiliac atherosclerosis.  Discharge Condition: Improved Disposition: Home  Diet recommendation: Heart healthy  Filed Weights   05/29/15 0948  Weight: 97.977 kg (216 lb)    History of present illness:  70 yom PMH of CKD Stage III, cardiac pacemarker, Afib and diastolic heart failure presented from [facility] with vomiting. CT revealed SBO. NG tube was placed for decompression and general surgery was consulted.   Hospital Course:  Patient presented with nausea and vomiting, secondary to a small bowel obstruction. Surgery was consulted and did not feel he needed acute surgical intervention at that time. An NG tube was placed and he was NPO with significant improvement. His nausea, vomiting, and pain has resolved. Diet was advanced and tolerated. He has been cleared for discharge by general surgery. Patient noted to have an elevated troponin on admission. His EKG showed a paced rhythm. No evidence of ACS. Cardiology consulted, no further evaluation suggested. Hospitalization was  uncomplicated.  1. Small bowel obstruction, related to adhesive disease. Resolved. Tolerating diet. Cleared by surgery for discharge. 2. Afib, with long term anticoagulation use. Coumadin on hold. Resume when INR down. 3. CKD Stage III. At baseline. 4. Vomiting, secondary to SBO, resolved. Tolerating solid diet. 5. Essential HTN, stable. Continue home medications.  6. Hypokalemia, likely due to vomiting and Lasix. Replaced.  7. GERD continue on PPI 8. Elevated troponin. No chest pain. EKG paced. Consider demand ischemia in the setting of decrease in renal clearance. No evidence of ACS. No further evaluation per cardiology. 9. Bipolar disorder, unspecified. Stable.  10. Chronic diastolic congestive heart failure. Stable. 11. Fever. No recurrent fever. Chest x-ray unremarkable. Abx discontinued.  12. PMH hypertrophic cardiomyopathy, pacemaker, diastolic CHF, thrombocytosis, bipolar disorder.  13. Aortoiliac atherosclerosis.   Consultants:  General surgery   Cardiology  Procedures:  NG tube placed 12/20  Antibiotics:  Unasyn 12/20 >>12/22  Discharge Instructions     Current Discharge Medication List    CONTINUE these medications which have NOT CHANGED   Details  acetaminophen (TYLENOL) 650 MG CR tablet Take 650 mg by mouth 3 (three) times daily as needed for pain. TYLENOL ARTHRITIS    atorvastatin (LIPITOR) 40 MG tablet Take 1 tablet (40 mg total) by mouth daily at 6 PM. Qty: 30 tablet, Refills: 5    beta carotene w/minerals (OCUVITE) tablet Take 1 tablet by mouth daily.    Cholecalciferol (VITAMIN D3) 50000 UNITS CAPS Take 50,000 Units by mouth every 30 (thirty) days.    esomeprazole (NEXIUM) 20 MG capsule Take 20 mg by mouth daily at 12 noon.    fenofibrate 160 MG tablet  Take 1 tablet (160 mg total) by mouth daily. Qty: 30 tablet, Refills: 5   Associated Diagnoses: HLD (hyperlipidemia)    fexofenadine (ALLEGRA) 60 MG tablet Take 60 mg by mouth daily.     furosemide (LASIX) 20 MG tablet Take 80 mg by mouth 2 (two) times daily.     gabapentin (NEURONTIN) 100 MG capsule Take 100 mg by mouth 2 (two) times daily.     guaiFENesin (ROBITUSSIN) 100 MG/5ML SOLN Take 10 mLs by mouth every 4 (four) hours as needed for cough or to loosen phlegm.    hydroxyurea (HYDREA) 500 MG capsule Take 500 mg by mouth daily. May take with food to minimize GI side effects.    ipratropium-albuterol (DUONEB) 0.5-2.5 (3) MG/3ML SOLN Take 3 mLs by nebulization every 6 (six) hours as needed (congestion).    ketotifen (ZADITOR) 0.025 % ophthalmic solution Place 1 drop into both eyes 2 (two) times daily.    metoprolol tartrate (LOPRESSOR) 25 MG tablet Take 25 mg by mouth 2 (two) times daily.    nitroGLYCERIN (NITRODUR - DOSED IN MG/24 HR) 0.2 mg/hr Place 1 patch onto the skin daily.    potassium chloride (MICRO-K) 10 MEQ CR capsule Take 20 mEq by mouth 3 (three) times daily with meals.    risperiDONE (RISPERDAL) 1 MG tablet Take 0.5 mg by mouth daily. 0.5mg  by mouth in the am and 2 tablets in the evening    senna (SENOKOT) 8.6 MG TABS Take 1 tablet by mouth at bedtime.     simethicone (MYTAB GAS) 80 MG chewable tablet Chew 160 mg by mouth once as needed for flatulence (bloating).    tamsulosin (FLOMAX) 0.4 MG CAPS capsule Take 0.4 mg by mouth daily.     vitamin B-12 (CYANOCOBALAMIN) 1000 MCG tablet Take 1,000 mcg by mouth daily.        STOP taking these medications     amoxicillin-clavulanate (AUGMENTIN) 875-125 MG tablet      omeprazole (PRILOSEC) 20 MG capsule      promethazine (PHENERGAN) 25 MG tablet      warfarin (COUMADIN) 2 MG tablet        Allergies  Allergen Reactions  . Abilify [Aripiprazole]     Other reaction(s): Other (See Comments) Hallucinations  . Demerol     unknown  . Meperidine     Other reaction(s): Mental Status Changes (intolerance)  . Morphine Other (See Comments)    Hallucinations  . Morphine And Related      Hallucinations  . Risperdal [Risperidone] Other (See Comments)    Weakness, tiredness    The results of significant diagnostics from this hospitalization (including imaging, microbiology, ancillary and laboratory) are listed below for reference.    Significant Diagnostic Studies: Ct Abdomen Pelvis Wo Contrast  05/29/2015  CLINICAL DATA:  Abdominal pain, distention and vomiting for 3 days. History of colon cancer. Initial encounter. EXAM: CT ABDOMEN AND PELVIS WITHOUT CONTRAST TECHNIQUE: Multidetector CT imaging of the abdomen and pelvis was performed following the standard protocol without IV contrast. COMPARISON:  None. FINDINGS: There are small bilateral pleural effusions, greater on the left, with some associated basilar atelectasis. Cardiomegaly is noted. No pericardial effusion. A few stones are seen in a decompressed gallbladder. There is no evidence of cholecystitis. A 1.0 cm in diameter hypoattenuating lesion is seen in the inferior right hepatic lobe on image 29 with density units of 5.2 most consistent with a cyst. The liver is otherwise unremarkable. The spleen, adrenal glands, pancreas and biliary tree are  unremarkable. The kidneys are atrophic bilaterally. Small left renal cyst is incidentally noted. The stomach is somewhat distended and there is mild distention of the proximal jejunum up to approximately 4.1 cm transition point appears to lie in the left upper quadrant the abdomen where small bowel loops appear adhesed together. No pneumatosis, portal venous gas or free intraperitoneal air is identified. A few sigmoid diverticula are noted. The colon is otherwise unremarkable. Aortoiliac atherosclerosis without aneurysm is identified. There is no lymphadenopathy or fluid collection. The prostate gland is mildly prominent. Urinary bladder wall is somewhat irregular with small diverticula noted. No focal bony abnormality is seen. IMPRESSION: Findings most consistent with proximal small bowel  obstruction likely due to adhesions with a transition point in the left upper quadrant. Marked cardiomegaly. Very small bilateral pleural effusions, greater on the left. Gallstones without evidence of cholecystitis. Aortoiliac atherosclerosis. Mild sigmoid diverticulosis without diverticulitis. Electronically Signed   By: Inge Rise M.D.   On: 05/29/2015 11:43   Portable Chest 1 View  05/29/2015  CLINICAL DATA:  Fevers and vomiting EXAM: PORTABLE CHEST - 1 VIEW COMPARISON:  11/16/2014 FINDINGS: Cardiac shadow is enlarged but stable. Vascular congestion and mild interstitial edema is noted. A pacing device is seen. A nasogastric catheter is seen extending into the stomach. No bony abnormality is noted. IMPRESSION: Mild vascular congestion and interstitial edema. No focal infiltrate is seen. Electronically Signed   By: Inez Catalina M.D.   On: 05/29/2015 17:10     Labs: Basic Metabolic Panel:  Recent Labs Lab 05/29/15 0954 05/30/15 0410 05/31/15 0606  NA 140 142 148*  K 3.2* 3.5 3.7  CL 89* 95* 103  CO2 40* 33* 33*  GLUCOSE 130* 119* 115*  BUN 30* 33* 32*  CREATININE 1.99* 2.07* 1.68*  CALCIUM 9.8 9.2 8.7*  MG  --  2.2 2.4  PHOS  --   --  2.5   Liver Function Tests:  Recent Labs Lab 05/29/15 0954 05/30/15 0410  AST 50* 47*  ALT 26 27  ALKPHOS 33* 31*  BILITOT 1.4* 1.4*  PROT 6.9 6.4*  ALBUMIN 3.8 3.4*    Recent Labs Lab 05/29/15 0954  LIPASE 40   CBC:  Recent Labs Lab 05/29/15 0954 05/30/15 0410 05/31/15 0606 06/01/15 0623  WBC 15.0* 13.3* 13.7* 12.1*  NEUTROABS 12.4*  --   --   --   HGB 11.5* 11.0* 11.0* 10.4*  HCT 35.8* 35.1* 36.4* 34.3*  MCV 93.0 93.6 95.8 96.1  PLT 481* 432* 429* 417*   Cardiac Enzymes:  Recent Labs Lab 05/29/15 0954 05/29/15 1702 05/29/15 2242 05/30/15 0410  TROPONINI 0.13* 0.14* 0.15* 0.15*    Principal Problem:   Small bowel obstruction (HCC) Active Problems:   Atrial fibrillation (HCC)   Long term (current) use of  anticoagulants   Bipolar disorder, unspecified (HCC)   CKD (chronic kidney disease) stage 3, GFR 30-59 ml/min   Hypokalemia   Vomiting   DNR (do not resuscitate)   HTN (hypertension)   BPH (benign prostatic hyperplasia)   GERD (gastroesophageal reflux disease)   HLD (hyperlipidemia)   Chronic diastolic congestive heart failure ([place])   Time coordinating discharge: 35 minutes  Signed:  [name], MD [facility] 06/01/2015, 11:43 AM  By signing my name below, I, Rosalie Doctor attest that this documentation has been prepared under the direction and in the presence of [name], MD Electronically signed: Rosalie Doctor, Scribe.  06/01/2015  I personally performed the services described in this documentation. All  medical record entries made by the scribe were at my direction. I have reviewed the chart and agree that the record reflects my personal performance and is accurate and complete. [name], MD

## 2015-06-01 NOTE — Clinical Social Work Note (Signed)
Pt d/c today back to [facility]. Pt, pt's daughter [name], and facility aware and agreeable. [name] will provide transportation.  Benay Pike, [place]

## 2015-06-01 NOTE — Care Management Note (Signed)
Case Management Note  Patient Details  Name: [name] MRN: [id number] Date of Birth: 1924/06/13   Expected Discharge Date:    06/01/2015              Expected Discharge Plan:  [place]  In-House Referral:  Clinical Social Work  Discharge planning Services  CM Consult  Post Acute Care Choice:  NA Choice offered to:  NA  DME Arranged:    DME Agency:     HH Arranged:    [place] Agency:     Status of Service:  Completed, signed off  Medicare Important Message Given:  Yes Date Medicare IM Given:    Medicare IM give by:    Date Additional Medicare IM Given:    Additional Medicare Important Message give by:     If discussed at [place] of Stay Meetings, dates discussed:    Additional Comments: Plan for pt to return to SNF today. CSW will arrange for return to facility. No CM needs.  Sherald Barge, RN 06/01/2015, 1:37 PM

## 2015-06-01 NOTE — Progress Notes (Signed)
  Subjective: Resting comfortably.  Objective: Vital signs in last 24 hours: Temp:  [97.8 F (36.6 C)-98 F (36.7 C)] 98 F (36.7 C) (12/23 0651) Pulse Rate:  [80-89] 87 (12/23 0651) Resp:  [20] 20 (12/23 0651) BP: (112-143)/(74-75) 124/75 mmHg (12/23 0651) SpO2:  [93 %-97 %] 97 % (12/23 0651) Last BM Date: 05/31/15  Intake/Output from previous day: 12/22 0701 - 12/23 0700 In: 360 [P.O.:360] Out: -  Intake/Output this shift:    GI: soft, non-tender; bowel sounds normal; no masses,  no organomegaly  Lab Results:   Recent Labs  05/31/15 0606 06/01/15 0623  WBC 13.7* 12.1*  HGB 11.0* 10.4*  HCT 36.4* 34.3*  PLT 429* 417*   BMET  Recent Labs  05/30/15 0410 05/31/15 0606  NA 142 148*  K 3.5 3.7  CL 95* 103  CO2 33* 33*  GLUCOSE 119* 115*  BUN 33* 32*  CREATININE 2.07* 1.68*  CALCIUM 9.2 8.7*   PT/INR  Recent Labs  05/30/15 0410 05/31/15 0606  LABPROT 33.9* 39.0*  INR 3.44* 4.14*    Studies/Results: No results found.  Anti-infectives: Anti-infectives    Start     Dose/Rate Route Frequency Ordered Stop   05/29/15 1645  Ampicillin-Sulbactam (UNASYN) 3 g in sodium chloride 0.9 % 100 mL IVPB  Status:  Discontinued     3 g 100 mL/hr over 60 Minutes Intravenous Every 8 hours 05/29/15 1632 05/31/15 1024      Assessment/Plan: Impression: Partial small bowel obstruction, resolving. Plan: Advance diet as tolerated. No need for acute surgical intervention. Will follow peripherally with you.  LOS: 3 days    Michail Boyte A 06/01/2015

## 2015-06-01 NOTE — NC FL2 (Signed)
[place] LEVEL OF CARE SCREENING TOOL     IDENTIFICATION  Patient Name: [name] Birthdate: 1924-12-01 Sex: male Admission Date (Current Location): 05/29/2015  [place] and [place] Number:  [place] [id number] [place] and Address:  [facility], [place] [address] [place], [place] [address]      Provider Number: [id number]  Attending Physician Name and Address:  Samuella Cota, MD  Relative Name and Phone Number:       Current Level of Care: Hospital Recommended Level of Care: [place] Prior Approval Number:    Date Approved/Denied:   PASRR Number: [id number]  Discharge Plan: SNF    Current Diagnoses: Patient Active Problem List   Diagnosis Date Noted  . Small bowel obstruction ([place]) 05/29/2015  . Hypokalemia 05/29/2015  . Vomiting 05/29/2015  . DNR (do not resuscitate) 05/29/2015  . HTN (hypertension) 05/29/2015  . BPH (benign prostatic hyperplasia) 05/29/2015  . GERD (gastroesophageal reflux disease) 05/29/2015  . HLD (hyperlipidemia) 05/29/2015  . Chronic diastolic congestive heart failure ([place]) 05/29/2015  . Dyspnea 03/23/2013  . Left shoulder pain 03/21/2013  . CKD (chronic kidney disease) stage 3, GFR 30-59 ml/min 03/16/2013  . Chest pain with low risk for cardiac etiology- low risk stress test; likely musculoskeltal  03/15/2013  . Chest pain 03/14/2013  . Shortness of breath 03/14/2013  . CKD (chronic kidney disease) 01/04/2013  . Anemia 01/04/2013  . Hypertrophic cardiomyopathy ([place]) 01/04/2013  . Bipolar disorder, unspecified ([place]) 01/04/2013  . Diastolic heart failure ([place]) 01/04/2013  . Cardiac pacemaker in situ 01/04/2013  . Thrombocytosis ([place]) 01/04/2013  . Depression 01/04/2013  . Long term (current) use of anticoagulants 10/06/2012  . Atrial fibrillation ([place]) 10/05/2012  . ACL tear 10/05/2012    Orientation RESPIRATION BLADDER Height & Weight    Self, Place  Normal  Incontinent 5\' 10"  (177.8 cm) 216 lbs.  BEHAVIORAL SYMPTOMS/MOOD NEUROLOGICAL BOWEL NUTRITION STATUS  Other (Comment) (n/a)  (n/a) Incontinent Diet (soft diet. heart healthy)  AMBULATORY STATUS COMMUNICATION OF NEEDS Skin   Extensive Assist Verbally Other (Comment) (excoriated sacrum)                       Personal Care Assistance Level of Assistance  Bathing, Feeding, Dressing Bathing Assistance: Maximum assistance Feeding assistance: Limited assistance Dressing Assistance: Maximum assistance     Functional Limitations Info  Sight, Hearing, Speech Sight Info: Adequate Hearing Info: Adequate Speech Info: Adequate    SPECIAL CARE FACTORS FREQUENCY                       Contractures      Additional Factors Info  Psychotropic Code Status Info: DNR Allergies Info: Ability, Demerol, Meperidine, Morphine, Morhpine and Related, Risperdal Psychotropic Info: Haldol         Current Medications (06/01/2015):  This is the current hospital active medication list Current Facility-Administered Medications  Medication Dose Route Frequency Provider Last Rate Last Dose  . acetaminophen (TYLENOL) tablet 650 mg  650 mg Oral Q6H PRN Kathie Dike, MD       Or  . acetaminophen (TYLENOL) suppository 650 mg  650 mg Rectal Q6H PRN Kathie Dike, MD      . antiseptic oral rinse (CPC / CETYLPYRIDINIUM CHLORIDE 0.05%) solution 7 mL  7 mL Mouth Rinse q12n4p Kathie Dike, MD   7 mL at 06/01/15 1304  . atorvastatin (LIPITOR) tablet 40 mg  40 mg Oral q1800 Samuella Cota,  MD      . chlorhexidine (PERIDEX) 0.12 % solution 15 mL  15 mL Mouth Rinse BID Kathie Dike, MD   15 mL at 06/01/15 1125  . gabapentin (NEURONTIN) capsule 100 mg  100 mg Oral BID Samuella Cota, MD   100 mg at 06/01/15 1302  . hydroxyurea (HYDREA) capsule 500 mg  500 mg Oral Daily Samuella Cota, MD   500 mg at 06/01/15 1425  . ipratropium-albuterol (DUONEB) 0.5-2.5 (3) MG/3ML nebulizer solution 3 mL  3 mL  Nebulization Q6H PRN Kathie Dike, MD      . ketotifen (ZADITOR) 0.025 % ophthalmic solution 1 drop  1 drop Both Eyes BID Samuella Cota, MD   1 drop at 06/01/15 1426  . metoprolol tartrate (LOPRESSOR) tablet 12.5 mg  12.5 mg Oral BID Lendon Colonel, NP   12.5 mg at 06/01/15 1125  . nitroGLYCERIN (NITRODUR - Dosed in mg/24 hr) patch 0.2 mg  0.2 mg Transdermal Daily Samuella Cota, MD   0.2 mg at 06/01/15 1303  . ondansetron (ZOFRAN) tablet 4 mg  4 mg Oral Q6H PRN Kathie Dike, MD   4 mg at 05/31/15 1052   Or  . ondansetron (ZOFRAN) injection 4 mg  4 mg Intravenous Q6H PRN Kathie Dike, MD   4 mg at 05/30/15 1550  . pantoprazole (PROTONIX) EC tablet 40 mg  40 mg Oral Daily Samuella Cota, MD   40 mg at 06/01/15 1303  . polyethylene glycol (MIRALAX / GLYCOLAX) packet 17 g  17 g Oral Daily Aviva Signs, MD   17 g at 06/01/15 1000  . risperiDONE (RISPERDAL) tablet 0.5 mg  0.5 mg Oral Daily Samuella Cota, MD   0.5 mg at 06/01/15 1303   And  . risperiDONE (RISPERDAL) tablet 1 mg  1 mg Oral QHS Samuella Cota, MD      . simethicone [facility]) chewable tablet 160 mg  160 mg Oral Once PRN Samuella Cota, MD      . sodium chloride 0.9 % injection 3 mL  3 mL Intravenous Q12H Kathie Dike, MD   3 mL at 06/01/15 1126  . tamsulosin (FLOMAX) capsule 0.4 mg  0.4 mg Oral Daily Samuella Cota, MD   0.4 mg at 06/01/15 1303  . [START ON 06/02/2015] Warfarin - Pharmacist Dosing Inpatient   Does not apply Q24H Fay Records, MD         Discharge Medications: Please see discharge summary for a list of discharge medications.  Relevant Imaging Results:  Relevant Lab Results:   Additional Information    Salome Arnt, [place]

## 2015-06-01 NOTE — Progress Notes (Signed)
PROGRESS NOTE  [name] [id number] DOB: [birth year] DOA: 05/29/2015 PCP: Cyndee Brightly, MD  Summary: 47 yom PMH of CKD Stage III, cardiac pacemarker, Afib and diastolic heart failure presented from [facility] with [facility]. CT revealed SBO. NG tube was placed for decompression and general surgery has been consulted.   Assessment/Plan: 1. Small bowel obstruction, related to adhesive disease. Resolved. Tolerating diet. Cleared by surgery for discharge. 2. Afib, with long term anticoagulation use. Coumadin on hold. Resume when INR down. 3. CKD Stage III. At baseline. 4. Vomiting, secondary to SBO, resolved. Tolerating solid diet. 5. Essential HTN, stable. Continue home medications.  6. Hypokalemia, likely due to vomiting and Lasix. Replaced.  7. GERD continue on PPI 8. Elevated troponin. No chest pain. EKG paced. Consider demand ischemia in the setting of decrease in renal clearance. No evidence of ACS. No further evaluation per cardiology. 9. Bipolar disorder, unspecified. Stable.   10. Chronic diastolic congestive heart failure. Stable. 11. Fever. No recurrent fever. Chest x-ray unremarkable. Abx discontinued.  12. PMH hypertrophic cardiomyopathy, pacemaker, diastolic CHF, thrombocytosis, bipolar disorder.   13. Aortoiliac atherosclerosis.   Overall improving, tolerating diet. Return to SNF today.  Discussed with daughter at bedside.  [name], MD  [facility]  Pager [phone] If 7PM-7AM, please contact night-coverage at [email or web address], password [facility] 06/01/2015, 9:20 AM  LOS: 3 days   Consultants:  General surgery   Cardiology  Procedures:  NG tube placed 12/20  Antibiotics:  Unasyn 12/20 >>12/22  HPI/Subjective: Feels good, tolerated lunch, no nausea now, no vomiting, no abd pain. Ready to go.  Objective: Filed Vitals:   05/31/15 1412 05/31/15 2024 05/31/15 2156 06/01/15 0651  BP: 143/75  112/74 124/75  Pulse: 80  89  87  Temp: 97.8 F (36.6 C)  97.9 F (36.6 C) 98 F (36.7 C)  TempSrc: Oral  Oral Oral  Resp: 20  20 20   Height:      Weight:      SpO2: 93% 96% 93% 97%    Intake/Output Summary (Last 24 hours) at 06/01/15 0920 Last data filed at 05/31/15 1700  Gross per 24 hour  Intake    360 ml  Output      0 ml  Net    360 ml     Filed Weights   05/29/15 0948  Weight: 97.977 kg (216 lb)    Exam:    VSS, afebrile, not hypoxic  General:  Appears well, calm and comfortable Cardiovascular: RRR, no m/r/g. No LE edema. Respiratory: CTA bilaterally, no w/r/r. Normal respiratory effort. Abdomen: soft, positive bowel sounds, distended, nontender Musculoskeletal: grossly normal tone BUE/BLE Psychiatric: grossly normal mood and affect, speech fluent and appropriate Neurologic: grossly non-focal.  New data reviewed:  Hgb stable. Leukocytosis trending downwards  INR 4.32, peaking   Scheduled Meds: . antiseptic oral rinse  7 mL Mouth Rinse q12n4p  . chlorhexidine  15 mL Mouth Rinse BID  . metoprolol tartrate  12.5 mg Oral BID  . pantoprazole (PROTONIX) IV  40 mg Intravenous Q24H  . sodium chloride  3 mL Intravenous Q12H  . Warfarin - Physician Dosing Inpatient   Does not apply q1800   Continuous Infusions:    Principal Problem:   Small bowel obstruction (HCC) Active Problems:   Atrial fibrillation (HCC)   Long term (current) use of anticoagulants   Bipolar disorder, unspecified (HCC)   CKD (chronic kidney disease) stage 3, GFR 30-59 ml/min   Hypokalemia   Vomiting   DNR (do not  resuscitate)   HTN (hypertension)   BPH (benign prostatic hyperplasia)   GERD (gastroesophageal reflux disease)   HLD (hyperlipidemia)   Chronic diastolic congestive heart failure ([place])     By signing my name below, I, Rosalie Doctor attest that this documentation has been prepared under the direction and in the presence of [name], MD Electronically signed: Rosalie Doctor, Scribe.  06/01/2015 11:50am   I personally performed the services described in this documentation. All medical record entries made by the scribe were at my direction. I have reviewed the chart and agree that the record reflects my personal performance and is accurate and complete. [name], MD

## 2015-06-06 ENCOUNTER — Non-Acute Institutional Stay (SKILLED_NURSING_FACILITY): Payer: Medicare Other | Admitting: Internal Medicine

## 2015-06-06 DIAGNOSIS — K56609 Unspecified intestinal obstruction, unspecified as to partial versus complete obstruction: Secondary | ICD-10-CM

## 2015-06-06 DIAGNOSIS — I482 Chronic atrial fibrillation, unspecified: Secondary | ICD-10-CM

## 2015-06-06 DIAGNOSIS — I5032 Chronic diastolic (congestive) heart failure: Secondary | ICD-10-CM | POA: Diagnosis not present

## 2015-06-06 DIAGNOSIS — K5669 Other intestinal obstruction: Secondary | ICD-10-CM

## 2015-06-06 NOTE — Progress Notes (Deleted)
Patient ID: [name], male   DOB: 11/27/1924, 81 y.o.   MRN: [id number]

## 2015-06-11 NOTE — Progress Notes (Addendum)
Patient ID: [name], male   DOB: Dec 04, 1924, 82 y.o.   MRN: [id number]                HISTORY & PHYSICAL  DATE:  06/06/2015        FACILITY: [facility]                 LEVEL OF CARE:   SNF   CHIEF COMPLAINT:  Readmission to the facility, post stay at [facility], 05/29/2015 through 06/01/2015.    HISTORY OF PRESENT ILLNESS:  Mr. [name] is a gentleman who has been in the building since the summer of 2015.  He is also cared for by [facility] services at [facility].    He has a history of chronic diastolic heart failure, chronic atrial fibrillation, on Coumadin.    He was admitted with abdominal distention, nausea and vomiting.  He was found to have a small bowel obstruction.  He was seen by General Surgery, but was managed conservatively.  An NG tube was placed.  He was kept NPO and his nausea, vomiting, and pain resolved.    He did have an elevated troponin on the admission.  His EKG showed a paced rhythm.  It was not felt that anything further needed to be done.  He was reviewed by Cardiology.    The overall feeling was that he had small bowel obstruction secondary to adhesions.     A CT scan of the abdomen done on 05/29/2015 showed small bilateral pleural effusions with some associated atelectasis.  He had a few stones and a decompressed gallbladder without evidence of cholecystitis.   His stomach appeared to be somewhat distended and there was mild distention of the proximal jejunum.  It was felt that the findings were most consistent with a proximal small bowel obstruction, likely secondary to adhesions with a transition point in the left upper quadrant.  He was noted to have marked cardiomegaly and small bilateral pleural effusions, gallstones without evidence of cholecystitis.    PAST MEDICAL HISTORY/PROBLEM LIST:   Past Medical History  Diagnosis Date  . Anemia   . Chronic constipation   . Colonic neoplasm   . Occult blood positive stool   . A-fib ([place])   .  Hypertrophic cardiomyopathy ([place])   . Bipolar disorder ([place])   . Diastolic heart failure   . Pacemaker     placed and [facility] at [facility]  . Depression   . Chronic kidney disease   . Thrombocytosis ([place])   . CKD (chronic kidney disease) stage 3, GFR 30-59 ml/min 03/16/2013  . CHF (congestive heart failure) ([place])   . Aphasia   . Cancer (HCC)     squamous cell  . Bipolar 1 disorder ([place])   . MDD (major depressive disorder) ([place])   . Insomnia   . Hypertension   . BPH (benign prostatic hyperplasia)   . Dysphagia   . GERD (gastroesophageal reflux disease)   . Angina at rest [facility])   . Hyperosmolality and hypernatremia       PAST SURGICAL HISTORY:   Past Surgical History  Procedure Laterality Date  . Colonoscopy  10/02/2010    W/POLYP  . Fracture surgery    . Pacemaker insertion      [facility] VVI     CURRENT MEDICATIONS:  Discharge medications include:      Tylenol 650 three times daily p.r.n.    Lipitor 40 q.d.      Vitamin D3, 50,000 U every  30 days.    Nexium 20 q.d.      Fenofibrate 160 q.d.     Allegra 60 q.d.     Lasix 80 b.i.d.      Neurontin 100 b.i.d.      Robitussin 10 mL q.4 p.r.n.      Hydroxyurea 500 mg daily.    DuoNebs q.6 hours p.r.n.      Lopressor 25 b.i.d.      Ketotifen 0.025%, 1 drop into both eyes two times daily.    Nitroglycerin patch 0.2 mg per hour.    KCl 1 mEq daily.    Simethicone chew 160 daily.    Flomax 0.5 q.d.      Vitamin B12, 1000 daily.      REVIEW OF SYSTEMS:       GENERAL:  The patient states he does not feel well.   CHEST/RESPIRATORY:  He is not complaining of cough or excessive shortness of breath.      CARDIAC:  No clear chest pain.    GI:  He has not really noted his abdominal distention.  He is not complaining of abdominal pain, nausea or vomiting.   GU:  No clear dysuria.     PHYSICAL EXAMINATION:   VITAL SIGNS:     PULSE:  71, and irregular.   RESPIRATIONS:  20, and unlabored.   02  SATURATIONS:  Pulse ox 96% on 2 L.   GENERAL APPEARANCE:  The patient is not in any distress.       CHEST/RESPIRATORY:  A few crackles in the left lower lobe.  Right lung is clear.  There is no wheezing.       CARDIOVASCULAR:   CARDIAC:  Atrial fibrillation.   There is a 3/6 systolic ejection murmur at the lower left sternal border.  His JVP is elevated.   EDEMA/VARICOSITIES:  He has 2-3+ coccyx and pedal edema.   GASTROINTESTINAL:   ABDOMEN:  Distended.  Bowel sounds are high-pitched and sparse.  There is no obvious mass.   HERNIA:  No obvious herniation.   LIVER/SPLEEN/KIDNEY:   No organomegaly.       GENITOURINARY:   BLADDER:  No suprapubic or costovertebral angle tenderness.   CIRCULATION:   EDEMA/VARICOSITIES:  Extremities:  He has 2-3+ peripheral edema bilaterally.    ASSESSMENT/PLAN:                 Small bowel obstruction in the hospital.  He had a KUB that was negative already in the facility.  I would advise routine MiraLAX, perhaps Dulcolax.  He may be developing an ileus.  His x-ray did not suggest obstruction.    Fluid volume overload.  He is certainly not dehydrated.  Apparently, a chest x-ray suggested some degree of CHF.  I would increase his Lasix to 100 b.i.d.     Chronic atrial fibrillation.  Heart rate is controlled.  He is not on his Coumadin currently.     Lab work has already been ordered today including a CBC, BMP.    I would suggest increasing his Lasix to 100 b.i.d.     As his x-ray shows no evidence of obstruction, aggressive bowel regimens seem indicated.  His abdominal distention, however, is concerning.   I have discussed this with the [employer] practitioner.     CPT CODE: 60454

## 2015-06-15 ENCOUNTER — Telehealth: Payer: Self-pay | Admitting: Cardiology

## 2015-06-15 NOTE — Telephone Encounter (Signed)
Agree with recommendation  Jamarquis Crull MD, FACC   

## 2015-06-15 NOTE — Telephone Encounter (Signed)
Pt of Dr. Percival Spanish in SNF. Spoke w/ Maudie Mercury. States pt was recently in hospital for SBO - has been at their care facility prior to this, and came back.  Concern for fluid gain - historically on 80mg  BID and now on 100mg  BID after hospital discharge. After hospitalization he had had some increased edema, and there was concern for HF exacerbation. This was improved w/ dose increase of Lasix. Currently has some residual ankle swelling but a recent CXR was negative for concern of fluid.  Advised tentatively could go up to 120mg  but w limitation of 2-3 days - latest BMET showed elevated BUN & Cr, 32 and 1.68 respectively. Aware I will defer to [employer] for further recommendation.

## 2015-06-15 NOTE — Telephone Encounter (Signed)
Kim notified of recommendations and verb'd understanding.

## 2015-06-15 NOTE — Telephone Encounter (Signed)
Maudie Mercury is calling because she has a question about adding a medication for Mr. Marinoff . Please call   Thanks

## 2015-06-19 ENCOUNTER — Observation Stay (HOSPITAL_COMMUNITY): Payer: Medicare Other

## 2015-06-19 ENCOUNTER — Emergency Department (HOSPITAL_COMMUNITY): Payer: Medicare Other

## 2015-06-19 ENCOUNTER — Encounter (HOSPITAL_COMMUNITY): Payer: Self-pay | Admitting: Emergency Medicine

## 2015-06-19 ENCOUNTER — Observation Stay (HOSPITAL_COMMUNITY)
Admission: EM | Admit: 2015-06-19 | Discharge: 2015-06-21 | Disposition: A | Discharge status: Home / Self Care | Payer: Medicare Other | Attending: Internal Medicine | Admitting: Internal Medicine

## 2015-06-19 DIAGNOSIS — Z79899 Other long term (current) drug therapy: Secondary | ICD-10-CM | POA: Diagnosis not present

## 2015-06-19 DIAGNOSIS — R600 Localized edema: Secondary | ICD-10-CM | POA: Diagnosis not present

## 2015-06-19 DIAGNOSIS — I129 Hypertensive chronic kidney disease with stage 1 through stage 4 chronic kidney disease, or unspecified chronic kidney disease: Secondary | ICD-10-CM | POA: Diagnosis not present

## 2015-06-19 DIAGNOSIS — I5031 Acute diastolic (congestive) heart failure: Secondary | ICD-10-CM | POA: Diagnosis not present

## 2015-06-19 DIAGNOSIS — N400 Enlarged prostate without lower urinary tract symptoms: Secondary | ICD-10-CM | POA: Diagnosis not present

## 2015-06-19 DIAGNOSIS — Z859 Personal history of malignant neoplasm, unspecified: Secondary | ICD-10-CM | POA: Diagnosis not present

## 2015-06-19 DIAGNOSIS — R140 Abdominal distension (gaseous): Secondary | ICD-10-CM

## 2015-06-19 DIAGNOSIS — D649 Anemia, unspecified: Secondary | ICD-10-CM | POA: Insufficient documentation

## 2015-06-19 DIAGNOSIS — K219 Gastro-esophageal reflux disease without esophagitis: Secondary | ICD-10-CM | POA: Diagnosis not present

## 2015-06-19 DIAGNOSIS — I5021 Acute systolic (congestive) heart failure: Secondary | ICD-10-CM

## 2015-06-19 DIAGNOSIS — Z950 Presence of cardiac pacemaker: Secondary | ICD-10-CM | POA: Diagnosis not present

## 2015-06-19 DIAGNOSIS — N183 Chronic kidney disease, stage 3 (moderate): Secondary | ICD-10-CM | POA: Diagnosis not present

## 2015-06-19 DIAGNOSIS — I208 Other forms of angina pectoris: Secondary | ICD-10-CM | POA: Diagnosis not present

## 2015-06-19 DIAGNOSIS — K5900 Constipation, unspecified: Secondary | ICD-10-CM | POA: Insufficient documentation

## 2015-06-19 DIAGNOSIS — D473 Essential (hemorrhagic) thrombocythemia: Secondary | ICD-10-CM | POA: Insufficient documentation

## 2015-06-19 DIAGNOSIS — I509 Heart failure, unspecified: Secondary | ICD-10-CM

## 2015-06-19 DIAGNOSIS — I4891 Unspecified atrial fibrillation: Secondary | ICD-10-CM | POA: Insufficient documentation

## 2015-06-19 DIAGNOSIS — G4700 Insomnia, unspecified: Secondary | ICD-10-CM | POA: Diagnosis not present

## 2015-06-19 DIAGNOSIS — Z85828 Personal history of other malignant neoplasm of skin: Secondary | ICD-10-CM | POA: Diagnosis not present

## 2015-06-19 DIAGNOSIS — F319 Bipolar disorder, unspecified: Secondary | ICD-10-CM | POA: Insufficient documentation

## 2015-06-19 DIAGNOSIS — Z86018 Personal history of other benign neoplasm: Secondary | ICD-10-CM | POA: Insufficient documentation

## 2015-06-19 DIAGNOSIS — Z87891 Personal history of nicotine dependence: Secondary | ICD-10-CM | POA: Insufficient documentation

## 2015-06-19 DIAGNOSIS — Z66 Do not resuscitate: Secondary | ICD-10-CM | POA: Diagnosis not present

## 2015-06-19 DIAGNOSIS — R0602 Shortness of breath: Secondary | ICD-10-CM | POA: Diagnosis present

## 2015-06-19 DIAGNOSIS — E870 Hyperosmolality and hypernatremia: Secondary | ICD-10-CM | POA: Insufficient documentation

## 2015-06-19 DIAGNOSIS — I482 Chronic atrial fibrillation: Secondary | ICD-10-CM | POA: Diagnosis not present

## 2015-06-19 LAB — TROPONIN I
TROPONIN I: 0.05 ng/mL — AB (ref <0.031)
Troponin I: 0.06 ng/mL — ABNORMAL HIGH (ref <0.031)

## 2015-06-19 LAB — PROTIME-INR
INR: 4.55 — ABNORMAL HIGH (ref 0.00–1.49)
Prothrombin Time: 41.9 seconds — ABNORMAL HIGH (ref 11.6–15.2)

## 2015-06-19 LAB — CBC WITH DIFFERENTIAL/PLATELET
BASOS ABS: 0.1 10*3/uL (ref 0.0–0.1)
Basophils Relative: 1 %
EOS PCT: 1 %
Eosinophils Absolute: 0.1 10*3/uL (ref 0.0–0.7)
HCT: 33.8 % — ABNORMAL LOW (ref 39.0–52.0)
Hemoglobin: 10.5 g/dL — ABNORMAL LOW (ref 13.0–17.0)
LYMPHS ABS: 1.6 10*3/uL (ref 0.7–4.0)
LYMPHS PCT: 15 %
MCH: 29.6 pg (ref 26.0–34.0)
MCHC: 31.1 g/dL (ref 30.0–36.0)
MCV: 95.2 fL (ref 78.0–100.0)
MONO ABS: 0.5 10*3/uL (ref 0.1–1.0)
Monocytes Relative: 5 %
Neutro Abs: 8.3 10*3/uL — ABNORMAL HIGH (ref 1.7–7.7)
Neutrophils Relative %: 78 %
PLATELETS: 310 10*3/uL (ref 150–400)
RBC: 3.55 MIL/uL — AB (ref 4.22–5.81)
RDW: 19.6 % — ABNORMAL HIGH (ref 11.5–15.5)
WBC: 10.5 10*3/uL (ref 4.0–10.5)

## 2015-06-19 LAB — COMPREHENSIVE METABOLIC PANEL
ALT: 25 U/L (ref 17–63)
ANION GAP: 8 (ref 5–15)
AST: 37 U/L (ref 15–41)
Albumin: 3.6 g/dL (ref 3.5–5.0)
Alkaline Phosphatase: 43 U/L (ref 38–126)
BUN: 22 mg/dL — AB (ref 6–20)
CHLORIDE: 96 mmol/L — AB (ref 101–111)
CO2: 37 mmol/L — ABNORMAL HIGH (ref 22–32)
Calcium: 8.9 mg/dL (ref 8.9–10.3)
Creatinine, Ser: 1.47 mg/dL — ABNORMAL HIGH (ref 0.61–1.24)
GFR calc Af Amer: 47 mL/min — ABNORMAL LOW (ref >60)
GFR calc non Af Amer: 41 mL/min — ABNORMAL LOW (ref >60)
GLUCOSE: 136 mg/dL — AB (ref 65–99)
POTASSIUM: 3.8 mmol/L (ref 3.5–5.1)
Sodium: 141 mmol/L (ref 135–145)
TOTAL PROTEIN: 7.1 g/dL (ref 6.5–8.1)
Total Bilirubin: 0.8 mg/dL (ref 0.3–1.2)

## 2015-06-19 LAB — URINALYSIS, ROUTINE W REFLEX MICROSCOPIC
BILIRUBIN URINE: NEGATIVE
GLUCOSE, UA: NEGATIVE mg/dL
HGB URINE DIPSTICK: NEGATIVE
KETONES UR: NEGATIVE mg/dL
Leukocytes, UA: NEGATIVE
Nitrite: NEGATIVE
PH: 5.5 (ref 5.0–8.0)
PROTEIN: NEGATIVE mg/dL
Specific Gravity, Urine: 1.02 (ref 1.005–1.030)

## 2015-06-19 LAB — I-STAT CG4 LACTIC ACID, ED
Lactic Acid, Venous: 1.55 mmol/L (ref 0.5–2.0)
Lactic Acid, Venous: 2.12 mmol/L (ref 0.5–2.0)

## 2015-06-19 LAB — BRAIN NATRIURETIC PEPTIDE: B Natriuretic Peptide: 693 pg/mL — ABNORMAL HIGH (ref 0.0–100.0)

## 2015-06-19 MED ORDER — SODIUM CHLORIDE 0.9 % IV SOLN: 250.0000 mL | INTRAVENOUS | Status: DC | PRN

## 2015-06-19 MED ORDER — IPRATROPIUM-ALBUTEROL 0.5-2.5 (3) MG/3ML IN SOLN
3.0000 mL | Freq: Four times a day (QID) | RESPIRATORY_TRACT | Status: DC
  Administered 2015-06-19: 3 mL via RESPIRATORY_TRACT
  Filled 2015-06-19: qty 3

## 2015-06-19 MED ORDER — LORATADINE 10 MG PO TABS
10.0000 mg | ORAL_TABLET | Freq: Every day | ORAL | Status: DC
  Administered 2015-06-20: 10 mg via ORAL
  Filled 2015-06-19 (×2): qty 1

## 2015-06-19 MED ORDER — SODIUM CHLORIDE 0.9 % IJ SOLN
3.0000 mL | Freq: Two times a day (BID) | INTRAMUSCULAR | Status: DC
  Administered 2015-06-19 – 2015-06-21 (×3): 3 mL via INTRAVENOUS

## 2015-06-19 MED ORDER — ONDANSETRON HCL 4 MG/2ML IJ SOLN: 4.0000 mg | Freq: Three times a day (TID) | INTRAMUSCULAR | Status: DC | PRN

## 2015-06-19 MED ORDER — PANTOPRAZOLE SODIUM 40 MG PO TBEC
40.0000 mg | DELAYED_RELEASE_TABLET | Freq: Every day | ORAL | Status: DC
  Administered 2015-06-19 – 2015-06-20 (×2): 40 mg via ORAL
  Filled 2015-06-19 (×3): qty 1

## 2015-06-19 MED ORDER — ONDANSETRON HCL 4 MG PO TABS: 4.0000 mg | ORAL_TABLET | Freq: Four times a day (QID) | ORAL | Status: DC | PRN

## 2015-06-19 MED ORDER — GABAPENTIN 100 MG PO CAPS
100.0000 mg | ORAL_CAPSULE | Freq: Two times a day (BID) | ORAL | Status: DC
  Administered 2015-06-19 – 2015-06-20 (×3): 100 mg via ORAL
  Filled 2015-06-19 (×4): qty 1

## 2015-06-19 MED ORDER — HYDROXYUREA 500 MG PO CAPS
500.0000 mg | ORAL_CAPSULE | Freq: Every day | ORAL | Status: DC
  Administered 2015-06-20: 500 mg via ORAL
  Filled 2015-06-19 (×2): qty 1

## 2015-06-19 MED ORDER — TAMSULOSIN HCL 0.4 MG PO CAPS
0.4000 mg | ORAL_CAPSULE | Freq: Every day | ORAL | Status: DC
  Administered 2015-06-20: 0.4 mg via ORAL
  Filled 2015-06-19 (×2): qty 1

## 2015-06-19 MED ORDER — SODIUM CHLORIDE 0.9 % IJ SOLN: 3.0000 mL | INTRAMUSCULAR | Status: DC | PRN

## 2015-06-19 MED ORDER — FUROSEMIDE 40 MG PO TABS: 40.0000 mg | ORAL_TABLET | Freq: Once | ORAL | Status: DC

## 2015-06-19 MED ORDER — ALBUTEROL (5 MG/ML) CONTINUOUS INHALATION SOLN
10.0000 mg/h | INHALATION_SOLUTION | RESPIRATORY_TRACT | Status: DC
  Administered 2015-06-19: 10 mg/h via RESPIRATORY_TRACT
  Filled 2015-06-19: qty 20

## 2015-06-19 MED ORDER — ACETAMINOPHEN 650 MG RE SUPP: 650.0000 mg | Freq: Four times a day (QID) | RECTAL | Status: DC | PRN

## 2015-06-19 MED ORDER — METOPROLOL TARTRATE 25 MG PO TABS
25.0000 mg | ORAL_TABLET | Freq: Two times a day (BID) | ORAL | Status: DC
  Administered 2015-06-19 – 2015-06-20 (×3): 25 mg via ORAL
  Filled 2015-06-19 (×4): qty 1

## 2015-06-19 MED ORDER — LORAZEPAM 0.5 MG PO TABS
0.5000 mg | ORAL_TABLET | Freq: Four times a day (QID) | ORAL | Status: DC | PRN
  Administered 2015-06-19 – 2015-06-20 (×2): 0.5 mg via ORAL
  Filled 2015-06-19 (×3): qty 1

## 2015-06-19 MED ORDER — WARFARIN - PHARMACIST DOSING INPATIENT: Freq: Every day | Status: DC

## 2015-06-19 MED ORDER — ATORVASTATIN CALCIUM 40 MG PO TABS: 40.0000 mg | ORAL_TABLET | Freq: Every day | ORAL | Status: DC

## 2015-06-19 MED ORDER — ACETAMINOPHEN 325 MG PO TABS: 650.0000 mg | ORAL_TABLET | Freq: Four times a day (QID) | ORAL | Status: DC | PRN

## 2015-06-19 MED ORDER — ENOXAPARIN SODIUM 40 MG/0.4ML ~~LOC~~ SOLN
40.0000 mg | SUBCUTANEOUS | Status: DC
  Administered 2015-06-19: 40 mg via SUBCUTANEOUS
  Filled 2015-06-19: qty 0.4

## 2015-06-19 MED ORDER — FUROSEMIDE 10 MG/ML IJ SOLN
40.0000 mg | Freq: Once | INTRAMUSCULAR | Status: AC
  Administered 2015-06-19: 40 mg via INTRAVENOUS
  Filled 2015-06-19: qty 4

## 2015-06-19 MED ORDER — ONDANSETRON HCL 4 MG/2ML IJ SOLN
4.0000 mg | Freq: Four times a day (QID) | INTRAMUSCULAR | Status: DC | PRN
  Administered 2015-06-20: 4 mg via INTRAVENOUS
  Filled 2015-06-19: qty 2

## 2015-06-19 MED ORDER — KETOTIFEN FUMARATE 0.025 % OP SOLN
1.0000 [drp] | Freq: Two times a day (BID) | OPHTHALMIC | Status: DC
  Administered 2015-06-20: 1 [drp] via OPHTHALMIC
  Filled 2015-06-19: qty 5

## 2015-06-19 MED ORDER — RISPERIDONE 0.5 MG PO TABS
0.5000 mg | ORAL_TABLET | Freq: Every day | ORAL | Status: DC
  Administered 2015-06-20: 0.5 mg via ORAL
  Filled 2015-06-19 (×2): qty 1

## 2015-06-19 MED ORDER — FUROSEMIDE 10 MG/ML IJ SOLN
20.0000 mg | Freq: Two times a day (BID) | INTRAMUSCULAR | Status: DC
  Administered 2015-06-19 – 2015-06-21 (×4): 20 mg via INTRAVENOUS
  Filled 2015-06-19 (×4): qty 2

## 2015-06-19 NOTE — ED Notes (Signed)
Per EMS pt has been having increased SOB - been treated for pneumonia last 2 weeks - Also has been having lasix for edema.

## 2015-06-19 NOTE — ED Notes (Signed)
Pericare and pt repositioned after completion of quick cath

## 2015-06-19 NOTE — Progress Notes (Signed)
ANTICOAGULATION CONSULT NOTE - Initial Consult  Pharmacy Consult for coumadin Indication: atrial fibrillation  Allergies  Allergen Reactions  . Abilify [Aripiprazole]     Other reaction(s): Other (See Comments) Hallucinations  . Demerol     unknown  . Meperidine     Other reaction(s): Mental Status Changes (intolerance)  . Morphine Other (See Comments)    Hallucinations  . Morphine And Related     Hallucinations  . Risperdal [Risperidone] Other (See Comments)    Weakness, tiredness    Patient Measurements: Height: 6\' 4"  (193 cm) Weight: 207 lb 15 oz (94.32 kg) IBW/kg (Calculated) : 86.8  Vital Signs: Temp: 98.5 F (36.9 C) (01/10 2023) Temp Source: Oral (01/10 2023) BP: 124/60 mmHg (01/10 2023) Pulse Rate: 78 (01/10 2023)  Labs:  Recent Labs  06/19/15 1513  HGB 10.5*  HCT 33.8*  PLT 310  LABPROT 41.9*  INR 4.55*  CREATININE 1.47*  TROPONINI 0.06*    Estimated Creatinine Clearance: 42.6 mL/min (by C-G formula based on Cr of 1.47).   Medical History: Past Medical History  Diagnosis Date  . Anemia   . Chronic constipation   . Colonic neoplasm   . Occult blood positive stool   . A-fib ([place])   . Hypertrophic cardiomyopathy ([place])   . Bipolar disorder ([place])   . Diastolic heart failure   . Pacemaker     [facility] and [facility] at [facility]  . Depression   . Chronic kidney disease   . Thrombocytosis ([place])   . CKD (chronic kidney disease) stage 3, GFR 30-59 ml/min 03/16/2013  . CHF (congestive heart failure) ([place])   . Aphasia   . Cancer (HCC)     squamous cell  . Bipolar 1 disorder ([place])   . MDD (major depressive disorder) ([place])   . Insomnia   . Hypertension   . BPH (benign prostatic hyperplasia)   . Dysphagia   . GERD (gastroesophageal reflux disease)   . Angina at rest [facility])   . Hyperosmolality and hypernatremia     Medications:  Prescriptions prior to admission  Medication Sig Dispense Refill Last Dose  . acetaminophen (TYLENOL) 650 MG CR tablet  Take 650 mg by mouth 3 (three) times daily as needed for pain. TYLENOL ARTHRITIS   Past Week at Unknown time  . atorvastatin (LIPITOR) 40 MG tablet Take 1 tablet (40 mg total) by mouth daily at 6 PM. 30 tablet 5 06/19/2015 at Unknown time  . beta carotene w/minerals (OCUVITE) tablet Take 1 tablet by mouth daily.   06/19/2015 at Unknown time  . Cholecalciferol (VITAMIN D3) 50000 UNITS CAPS Take 50,000 Units by mouth every 30 (thirty) days.   unknown  . esomeprazole (NEXIUM) 20 MG capsule Take 20 mg by mouth daily at 12 noon.   06/18/2015 at Unknown time  . fenofibrate 160 MG tablet Take 1 tablet (160 mg total) by mouth daily. 30 tablet 5 06/19/2015 at Unknown time  . fexofenadine (ALLEGRA) 60 MG tablet Take 60 mg by mouth daily.   06/19/2015 at Unknown time  . furosemide (LASIX) 20 MG tablet Take 80 mg by mouth 2 (two) times daily.    06/18/2015 at Unknown time  . gabapentin (NEURONTIN) 100 MG capsule Take 100 mg by mouth 2 (two) times daily.    06/19/2015 at Unknown time  . guaiFENesin (ROBITUSSIN) 100 MG/5ML SOLN Take 10 mLs by mouth every 4 (four) hours as needed for cough or to loosen phlegm.   Past Week at Unknown time  .  hydroxyurea (HYDREA) 500 MG capsule Take 500 mg by mouth daily. May take with food to minimize GI side effects.   06/19/2015 at Unknown time  . ipratropium-albuterol (DUONEB) 0.5-2.5 (3) MG/3ML SOLN Take 3 mLs by nebulization every 6 (six) hours as needed (congestion).   06/19/2015 at Unknown time  . ketotifen (ZADITOR) 0.025 % ophthalmic solution Place 1 drop into both eyes 2 (two) times daily.   06/19/2015 at Unknown time  . LORazepam (ATIVAN) 0.5 MG tablet Take 0.5 mg by mouth every 6 (six) hours as needed for anxiety.   06/19/2015 at Unknown time  . metoprolol tartrate (LOPRESSOR) 25 MG tablet Take 25 mg by mouth 2 (two) times daily.   06/19/2015 at 0900  . nitroGLYCERIN (NITRODUR - DOSED IN MG/24 HR) 0.2 mg/hr Place 1 patch onto the skin daily.   06/19/2015 at Unknown time  . polyethylene  glycol powder (GLYCOLAX/MIRALAX) powder Take 1 Container by mouth once.   06/19/2015 at Unknown time  . potassium chloride (MICRO-K) 10 MEQ CR capsule Take 20 mEq by mouth 3 (three) times daily with meals.   06/19/2015 at Unknown time  . risperiDONE (RISPERDAL) 1 MG tablet Take 0.5 mg by mouth daily. 0.5mg  by mouth in the am and 2 tablets in the evening   06/19/2015 at Unknown time  . tamsulosin (FLOMAX) 0.4 MG CAPS capsule Take 0.4 mg by mouth daily.    06/19/2015 at Unknown time  . vitamin B-12 (CYANOCOBALAMIN) 1000 MCG tablet Take 1,000 mcg by mouth daily.     06/19/2015 at Unknown time  . senna (SENOKOT) 8.6 MG TABS Take 1 tablet by mouth at bedtime.    05/28/2015 at 2100  . simethicone (MYTAB GAS) 80 MG chewable tablet Chew 160 mg by mouth once as needed for flatulence (bloating).   unknown    Assessment: 82 yo male presents with SOB. He has been coughing and gradually getting worse over last 2 weeks. Pt is chronically anticoagulated for Afib. INR is supratherapeutic at 4.55.   Goal of Therapy:  INR 2-3 Monitor platelets by anticoagulation protocol: Yes   Plan:  No coumadin today Monitor for S/S of bleeding PT/INR daily  Isac Sarna, BS Vena [place], BCPS Clinical Pharmacist Pager [phone] Cristy Friedlander 06/19/2015,8:36 PM

## 2015-06-19 NOTE — ED Notes (Signed)
Dr Sabra Heck in room with pt and his daughter--

## 2015-06-19 NOTE — H&P (Signed)
PCP:   Cyndee Brightly, MD   Chief Complaint:  Shortness of breath  HPI:  82 year old male who  has a past medical history of Anemia; Chronic constipation; Colonic neoplasm; Occult blood positive stool; A-fib ([place]); Hypertrophic cardiomyopathy ([place]); Bipolar disorder ([place]); Diastolic heart failure; Pacemaker; Depression; Chronic kidney disease; Thrombocytosis ([place]); CKD (chronic kidney disease) stage 3, GFR 30-59 ml/min (03/16/2013); CHF (congestive heart failure) ([place]); Aphasia; Cancer ([place]); Bipolar 1 disorder ([place]); MDD (major depressive disorder) ([place]); Insomnia; Hypertension; BPH (benign prostatic hyperplasia); Dysphagia; GERD (gastroesophageal reflux disease); Angina at rest [facility]); and Hyperosmolality and hypernatremia. Today was brought to the ED from skilled facility after worsening shortness of breath. As per patient's daughter, patient has been coughing, with white colored phlegm. He has been gradually getting worse for the past 2 weeks. Denies chest pain. No nausea vomiting or diarrhea.  In the ED found to have elevated BNP 693.0 Patient has history of diastolic heart failure, given one dose of Lasix 40 mg IV 1 in the ED.   Allergies:   Allergies  Allergen Reactions  . Abilify [Aripiprazole]     Other reaction(s): Other (See Comments) Hallucinations  . Demerol     unknown  . Meperidine     Other reaction(s): Mental Status Changes (intolerance)  . Morphine Other (See Comments)    Hallucinations  . Morphine And Related     Hallucinations  . Risperdal [Risperidone] Other (See Comments)    Weakness, tiredness      Past Medical History  Diagnosis Date  . Anemia   . Chronic constipation   . Colonic neoplasm   . Occult blood positive stool   . A-fib ([place])   . Hypertrophic cardiomyopathy ([place])   . Bipolar disorder ([place])   . Diastolic heart failure   . Pacemaker     placed and followed at [facility]  . Depression   . Chronic kidney disease   . Thrombocytosis  ([place])   . CKD (chronic kidney disease) stage 3, GFR 30-59 ml/min 03/16/2013  . CHF (congestive heart failure) ([place])   . Aphasia   . Cancer (HCC)     squamous cell  . Bipolar 1 disorder ([place])   . MDD (major depressive disorder) ([place])   . Insomnia   . Hypertension   . BPH (benign prostatic hyperplasia)   . Dysphagia   . GERD (gastroesophageal reflux disease)   . Angina at rest [facility])   . Hyperosmolality and hypernatremia     Past Surgical History  Procedure Laterality Date  . Colonoscopy  10/02/2010    W/POLYP  . Fracture surgery    . Pacemaker insertion      [facility]    Prior to Admission medications   Medication Sig Start Date End Date Taking? Authorizing Provider  acetaminophen (TYLENOL) 650 MG CR tablet Take 650 mg by mouth 3 (three) times daily as needed for pain. TYLENOL ARTHRITIS   Yes Historical Provider, MD  atorvastatin (LIPITOR) 40 MG tablet Take 1 tablet (40 mg total) by mouth daily at 6 PM. 03/15/13  Yes Brittainy Erie Noe, PA-C  beta carotene w/minerals (OCUVITE) tablet Take 1 tablet by mouth daily.   Yes Historical Provider, MD  Cholecalciferol (VITAMIN D3) 50000 UNITS CAPS Take 50,000 Units by mouth every 30 (thirty) days.   Yes Historical Provider, MD  esomeprazole (NEXIUM) 20 MG capsule Take 20 mg by mouth daily at 12 noon.   Yes Historical Provider, MD  fenofibrate 160 MG tablet Take 1 tablet (160  mg total) by mouth daily. 01/06/13  Yes Vernie Shanks, MD  fexofenadine (ALLEGRA) 60 MG tablet Take 60 mg by mouth daily.   Yes Historical Provider, MD  furosemide (LASIX) 20 MG tablet Take 80 mg by mouth 2 (two) times daily.    Yes Historical Provider, MD  gabapentin (NEURONTIN) 100 MG capsule Take 100 mg by mouth 2 (two) times daily.    Yes Historical Provider, MD  guaiFENesin (ROBITUSSIN) 100 MG/5ML SOLN Take 10 mLs by mouth every 4 (four) hours as needed for cough or to loosen phlegm.   Yes Historical Provider, MD  hydroxyurea (HYDREA) 500 MG capsule Take  500 mg by mouth daily. May take with food to minimize GI side effects.   Yes Historical Provider, MD  ipratropium-albuterol (DUONEB) 0.5-2.5 (3) MG/3ML SOLN Take 3 mLs by nebulization every 6 (six) hours as needed (congestion).   Yes Historical Provider, MD  ketotifen (ZADITOR) 0.025 % ophthalmic solution Place 1 drop into both eyes 2 (two) times daily.   Yes Historical Provider, MD  LORazepam (ATIVAN) 0.5 MG tablet Take 0.5 mg by mouth every 6 (six) hours as needed for anxiety.   Yes Historical Provider, MD  metoprolol tartrate (LOPRESSOR) 25 MG tablet Take 25 mg by mouth 2 (two) times daily.   Yes Historical Provider, MD  nitroGLYCERIN (NITRODUR - DOSED IN MG/24 HR) 0.2 mg/hr Place 1 patch onto the skin daily.   Yes Historical Provider, MD  polyethylene glycol powder (GLYCOLAX/MIRALAX) powder Take 1 Container by mouth once.   Yes Historical Provider, MD  potassium chloride (MICRO-K) 10 MEQ CR capsule Take 20 mEq by mouth 3 (three) times daily with meals.   Yes Historical Provider, MD  risperiDONE (RISPERDAL) 1 MG tablet Take 0.5 mg by mouth daily. 0.5mg  by mouth in the am and 2 tablets in the evening   Yes Historical Provider, MD  tamsulosin (FLOMAX) 0.4 MG CAPS capsule Take 0.4 mg by mouth daily.    Yes Historical Provider, MD  vitamin B-12 (CYANOCOBALAMIN) 1000 MCG tablet Take 1,000 mcg by mouth daily.     Yes Historical Provider, MD  senna (SENOKOT) 8.6 MG TABS Take 1 tablet by mouth at bedtime.     Historical Provider, MD  simethicone (MYTAB GAS) 80 MG chewable tablet Chew 160 mg by mouth once as needed for flatulence (bloating).    Historical Provider, MD    Social History:  reports that he quit smoking about 42 years ago. He has never used smokeless tobacco. He reports that he does not drink alcohol or use illicit drugs.  Family History  Problem Relation Age of Onset  . Heart disease Mother   . Heart disease Sister   . Heart disease Brother     [employer]   06/19/15 1441    Weight: 95.255 kg (210 lb)    All the positives are listed in BOLD  Review of Systems:  HEENT: Headache, blurred vision, runny nose, sore throat Neck: Hypothyroidism, hyperthyroidism,,lymphadenopathy Chest : Shortness of breath, history of COPD, Asthma, cough Heart : Chest pain, history of coronary arterey disease GI:  Nausea, vomiting, diarrhea, constipation, GERD GU: Dysuria, urgency, frequency of urination, hematuria Neuro: Stroke, seizures, syncope Psych: Depression, anxiety, hallucinations   Physical Exam: Blood pressure 121/67, pulse 77, temperature 98.4 F (36.9 C), temperature source Oral, resp. rate 32, height 6\' 4"  (1.93 m), weight 95.255 kg (210 lb), SpO2 99 %. Constitutional:   Patient is a well-developed and well-nourished male in mild respiratory distress and  cooperative with exam. Head: Normocephalic and atraumatic Mouth: Mucus membranes moist Eyes: PERRL, EOMI, conjunctivae normal Neck: Supple, No Thyromegaly Cardiovascular: RRR, S1 normal, S2 normal Pulmonary/Chest: Bilateral rhonchi Abdominal: Soft. Non-tender, distended, bowel sounds are normal, no masses, organomegaly, or guarding present.  Neurological: A&O x3, Strength is normal and symmetric bilaterally, cranial nerve II-XII are grossly intact, no focal motor deficit, sensory intact to light touch bilaterally.  Extremities : No Cyanosis, Clubbing or Edema  Labs on Admission:  Basic Metabolic Panel:  Recent Labs Lab 06/19/15 1513  NA 141  K 3.8  CL 96*  CO2 37*  GLUCOSE 136*  BUN 22*  CREATININE 1.47*  CALCIUM 8.9   Liver Function Tests:  Recent Labs Lab 06/19/15 1513  AST 37  ALT 25  ALKPHOS 43  BILITOT 0.8  PROT 7.1  ALBUMIN 3.6    CBC:  Recent Labs Lab 06/19/15 1513  WBC 10.5  NEUTROABS 8.3*  HGB 10.5*  HCT 33.8*  MCV 95.2  PLT 310   Cardiac Enzymes:  Recent Labs Lab 06/19/15 1513  TROPONINI 0.06*    BNP (last 3 results)  Recent Labs  06/19/15 1520  BNP  693.0*     Radiological Exams on Admission: Dg Chest Port 1 View  06/19/2015  CLINICAL DATA:  Increasing shortness of Breath EXAM: PORTABLE CHEST - 1 VIEW COMPARISON:  05/29/2015 FINDINGS: Cardiac shadow remains enlarged. A pacing device is again seen. Mild vascular congestion interstitial changes are again seen. No new focal infiltrate or sizable effusion is noted. IMPRESSION: Stable changes when compare with the prior exam. Electronically Signed   By: Inez Catalina M.D.   On: 06/19/2015 15:05       Assessment/Plan Active Problems:   Atrial fibrillation (HCC)   Cardiac pacemaker in situ   DNR (do not resuscitate)   CHF (congestive heart failure) (HCC)   Acute diastolic CHF (congestive heart failure) ([place])  Acute on chronic diastolic CHF Patient given Lasix 40 mg IV in the ED Will start Lasix 20 g IV every 12 hours from tonight Foley catheter to gravity  History of small bowel obstruction Patient has abdominal distention, recent history of small bowel obstruction due to adhesive disease. Manage conservatively. Will repeat abdominal x-ray Keep nothing by mouth.  History of paroxysmal atrial fibrillation Patient was on anticoagulation with Coumadin, which was on hold in previous admission due to high INR, and was recommended to restart Coumadin when INR below 3. Patient's medication list does not show Coumadin Coumadin was supposed to be restarted as per patient's daughter, will start warfarin per pharmacy consultation.  CKD stage III Currently patient at baseline  Mild elevation of troponin Troponin elevated 0.06 likely from demand ischemia from gastric CHF Will follow serial troponin.   Code status: DNR  Family discussion: Admission, patients condition and plan of care including tests being ordered have been discussed with the patient and HIS SON AND DAUGHTER AT BEDSIDE* who indicate understanding and agree with the plan and Code Status.   Time Spent on Admission: [facility] Pager: [phone] 06/19/2015, 6:56 PM  If 7PM-7AM, please contact night-coverage  [email or web address]  Password [email or web address]

## 2015-06-19 NOTE — ED Provider Notes (Signed)
CSN: [id number]     Arrival date & time 06/19/15  1428 History   First MD Initiated Contact with Patient 06/19/15 1434     Chief Complaint  Patient presents with  . Shortness of Breath     (Consider location/radiation/quality/duration/timing/severity/associated sxs/prior Treatment) HPI  The pt is an 82 y/o male - hx of CKD, CHF, CA and Htn pacemaker (no COPD), presents to ED by EMS after having 2 weeks of coughing - has just finished course of abx without improvement - found to be hypoxic, and in respiratory distress with wheezgin - given neb prior to arrival with some improvement - the pt has limited mobility at baseline living at [facility] facility - wheelchair mobility.  Sx are constant over last day - gradually worsening, now severe - paramedics denies hypoxia, or hypotension prior to arrival.  Past Medical History  Diagnosis Date  . Anemia   . Chronic constipation   . Colonic neoplasm   . Occult blood positive stool   . A-fib ([place])   . Hypertrophic cardiomyopathy ([place])   . Bipolar disorder ([place])   . Diastolic heart failure   . Pacemaker     placed and followed at [facility]  . Depression   . Chronic kidney disease   . Thrombocytosis ([place])   . CKD (chronic kidney disease) stage 3, GFR 30-59 ml/min 03/16/2013  . CHF (congestive heart failure) ([place])   . Aphasia   . Cancer (HCC)     squamous cell  . Bipolar 1 disorder ([place])   . MDD (major depressive disorder) ([place])   . Insomnia   . Hypertension   . BPH (benign prostatic hyperplasia)   . Dysphagia   . GERD (gastroesophageal reflux disease)   . Angina at rest [facility])   . Hyperosmolality and hypernatremia    Past Surgical History  Procedure Laterality Date  . Colonoscopy  10/02/2010    W/POLYP  . Fracture surgery    . Pacemaker insertion      [facility]   Family History  Problem Relation Age of Onset  . Heart disease Mother   . Heart disease Sister   . Heart disease Brother    Social History  Substance  Use Topics  . Smoking status: Former Smoker    Quit date: 10/05/1972  . Smokeless tobacco: Never Used  . Alcohol Use: No    Review of Systems  All other systems reviewed and are negative.     Allergies  Abilify; Demerol; Meperidine; Morphine; Morphine and related; and Risperdal  Home Medications   Prior to Admission medications   Medication Sig Start Date End Date Taking? Authorizing Provider  acetaminophen (TYLENOL) 650 MG CR tablet Take 650 mg by mouth 3 (three) times daily as needed for pain. TYLENOL ARTHRITIS   Yes Historical Provider, MD  atorvastatin (LIPITOR) 40 MG tablet Take 1 tablet (40 mg total) by mouth daily at 6 PM. 03/15/13  Yes Brittainy Erie Noe, PA-C  beta carotene w/minerals (OCUVITE) tablet Take 1 tablet by mouth daily.   Yes Historical Provider, MD  Cholecalciferol (VITAMIN D3) 50000 UNITS CAPS Take 50,000 Units by mouth every 30 (thirty) days.   Yes Historical Provider, MD  esomeprazole (NEXIUM) 20 MG capsule Take 20 mg by mouth daily at 12 noon.   Yes Historical Provider, MD  fenofibrate 160 MG tablet Take 1 tablet (160 mg total) by mouth daily. 01/06/13  Yes Vernie Shanks, MD  fexofenadine (ALLEGRA) 60 MG tablet Take 60 mg by  mouth daily.   Yes Historical Provider, MD  furosemide (LASIX) 20 MG tablet Take 80 mg by mouth 2 (two) times daily.    Yes Historical Provider, MD  gabapentin (NEURONTIN) 100 MG capsule Take 100 mg by mouth 2 (two) times daily.    Yes Historical Provider, MD  guaiFENesin (ROBITUSSIN) 100 MG/5ML SOLN Take 10 mLs by mouth every 4 (four) hours as needed for cough or to loosen phlegm.   Yes Historical Provider, MD  hydroxyurea (HYDREA) 500 MG capsule Take 500 mg by mouth daily. May take with food to minimize GI side effects.   Yes Historical Provider, MD  ipratropium-albuterol (DUONEB) 0.5-2.5 (3) MG/3ML SOLN Take 3 mLs by nebulization every 6 (six) hours as needed (congestion).   Yes Historical Provider, MD  ketotifen (ZADITOR) 0.025 %  ophthalmic solution Place 1 drop into both eyes 2 (two) times daily.   Yes Historical Provider, MD  LORazepam (ATIVAN) 0.5 MG tablet Take 0.5 mg by mouth every 6 (six) hours as needed for anxiety.   Yes Historical Provider, MD  metoprolol tartrate (LOPRESSOR) 25 MG tablet Take 25 mg by mouth 2 (two) times daily.   Yes Historical Provider, MD  nitroGLYCERIN (NITRODUR - DOSED IN MG/24 HR) 0.2 mg/hr Place 1 patch onto the skin daily.   Yes Historical Provider, MD  polyethylene glycol powder (GLYCOLAX/MIRALAX) powder Take 1 Container by mouth once.   Yes Historical Provider, MD  potassium chloride (MICRO-K) 10 MEQ CR capsule Take 20 mEq by mouth 3 (three) times daily with meals.   Yes Historical Provider, MD  risperiDONE (RISPERDAL) 1 MG tablet Take 0.5 mg by mouth daily. 0.5mg  by mouth in the am and 2 tablets in the evening   Yes Historical Provider, MD  tamsulosin (FLOMAX) 0.4 MG CAPS capsule Take 0.4 mg by mouth daily.    Yes Historical Provider, MD  vitamin B-12 (CYANOCOBALAMIN) 1000 MCG tablet Take 1,000 mcg by mouth daily.     Yes Historical Provider, MD  senna (SENOKOT) 8.6 MG TABS Take 1 tablet by mouth at bedtime.     Historical Provider, MD  simethicone (MYTAB GAS) 80 MG chewable tablet Chew 160 mg by mouth once as needed for flatulence (bloating).    Historical Provider, MD   BP 115/73 mmHg  Pulse 78  Temp(Src) 98.4 F (36.9 C) (Oral)  Resp 24  Ht 6\' 4"  (1.93 m)  Wt 210 lb (95.255 kg)  BMI 25.57 kg/m2  SpO2 100% Physical Exam  Constitutional: He appears well-developed and well-nourished. He appears distressed.  HENT:  Head: Normocephalic and atraumatic.  Mouth/Throat: Oropharynx is clear and moist. No oropharyngeal exudate.  Eyes: Conjunctivae and EOM are normal. Pupils are equal, round, and reactive to light. Right eye exhibits no discharge. Left eye exhibits no discharge. No scleral icterus.  Neck: Normal range of motion. Neck supple. No JVD present. No thyromegaly present.   Cardiovascular: Normal rate, regular rhythm, normal heart sounds and intact distal pulses.  Exam reveals no gallop and no friction rub.   No murmur heard. Pulmonary/Chest: He is in respiratory distress. He has wheezes. He has rales.  Abdominal: Soft. Bowel sounds are normal. He exhibits no distension and no mass. There is no tenderness.  Musculoskeletal: Normal range of motion. He exhibits edema. He exhibits no tenderness.  Lymphadenopathy:    He has no cervical adenopathy.  Neurological: He is alert. Coordination normal.  Skin: Skin is warm and dry. No rash noted. No erythema.  Psychiatric: He has a normal  mood and affect. His behavior is normal.  Nursing note and vitals reviewed.   ED Course  Procedures (including critical care time) Labs Review Labs Reviewed  COMPREHENSIVE METABOLIC PANEL - Abnormal; Notable for the following:    Chloride 96 (*)    CO2 37 (*)    Glucose, Bld 136 (*)    BUN 22 (*)    Creatinine, Ser 1.47 (*)    GFR calc non Af Amer 41 (*)    GFR calc Af Amer 47 (*)    All other components within normal limits  CBC WITH DIFFERENTIAL/PLATELET - Abnormal; Notable for the following:    RBC 3.55 (*)    Hemoglobin 10.5 (*)    HCT 33.8 (*)    RDW 19.6 (*)    Neutro Abs 8.3 (*)    All other components within normal limits  BRAIN NATRIURETIC PEPTIDE - Abnormal; Notable for the following:    B Natriuretic Peptide 693.0 (*)    All other components within normal limits  TROPONIN I - Abnormal; Notable for the following:    Troponin I 0.06 (*)    All other components within normal limits  CULTURE, BLOOD (ROUTINE X 2)  CULTURE, BLOOD (ROUTINE X 2)  URINE CULTURE  URINALYSIS, ROUTINE W REFLEX MICROSCOPIC (NOT AT [facility])  I-STAT CG4 LACTIC ACID, ED  I-STAT CG4 LACTIC ACID, ED    Imaging Review Dg Chest Port 1 View  06/19/2015  CLINICAL DATA:  Increasing shortness of Breath EXAM: PORTABLE CHEST - 1 VIEW COMPARISON:  05/29/2015 FINDINGS: Cardiac shadow remains  enlarged. A pacing device is again seen. Mild vascular congestion interstitial changes are again seen. No new focal infiltrate or sizable effusion is noted. IMPRESSION: Stable changes when compare with the prior exam. Electronically Signed   By: Inez Catalina M.D.   On: 06/19/2015 15:05   I have personally reviewed and evaluated these images and lab results as part of my medical decision-making.  ED ECG REPORT  I personally interpreted this EKG   Date: 06/19/2015   Rate: 70  Rhythm: V Paced  QRS Axis: left  Intervals: normal  ST/T Wave abnormalities: nonspecific T wave changes  Conduction Disutrbances:none  Narrative Interpretation:   Old EKG Reviewed: unchanged   MDM   Final diagnoses:  Acute systolic congestive heart failure (HCC)    The pt has a paced rhythm, he does now endorse some CP and L arm pain - no hx of COPD - consider infection vs cardiac wheeze - get labs and xray -   Labs show CHF, ongoing hypoxia requiring O2, D/w daughter - requests admit based on decompensated state D/w Dr. Darrick Meigs who will admit  Meds given in ED:  Medications  albuterol (PROVENTIL,VENTOLIN) solution continuous neb (10 mg/hr Nebulization New Bag/Given 06/19/15 1500)  furosemide (LASIX) injection 40 mg (40 mg Intravenous Given 06/19/15 1718)     Filed Vitals:   06/19/15 1530 06/19/15 1615 06/19/15 1616 06/19/15 1646  BP: 127/82  128/74 115/73  Pulse: 87  78 78  Temp:      TempSrc:      Resp: 25  31 24   Height:      Weight:      SpO2: 100% 100% 100% 100%     Noemi Chapel, MD 06/19/15 1747

## 2015-06-19 NOTE — ED Notes (Signed)
To hold off starting 2nd IV at this time , Per Dr Sanjuan Dame' instructions

## 2015-06-19 NOTE — ED Notes (Signed)
Continuous neb treatment completed- Stopped at this time

## 2015-06-19 NOTE — ED Notes (Signed)
Bladder scan performed as pt still not voided post lasix admin-- approx 142mls noted in bladder per scan

## 2015-06-20 ENCOUNTER — Telehealth: Payer: Self-pay | Admitting: Cardiology

## 2015-06-20 DIAGNOSIS — Z515 Encounter for palliative care: Secondary | ICD-10-CM | POA: Diagnosis not present

## 2015-06-20 DIAGNOSIS — I482 Chronic atrial fibrillation: Secondary | ICD-10-CM | POA: Diagnosis not present

## 2015-06-20 DIAGNOSIS — I5031 Acute diastolic (congestive) heart failure: Secondary | ICD-10-CM

## 2015-06-20 DIAGNOSIS — N183 Chronic kidney disease, stage 3 (moderate): Secondary | ICD-10-CM | POA: Diagnosis not present

## 2015-06-20 DIAGNOSIS — N179 Acute kidney failure, unspecified: Secondary | ICD-10-CM | POA: Diagnosis not present

## 2015-06-20 DIAGNOSIS — I5021 Acute systolic (congestive) heart failure: Secondary | ICD-10-CM | POA: Diagnosis not present

## 2015-06-20 LAB — COMPREHENSIVE METABOLIC PANEL
ALK PHOS: 34 U/L — AB (ref 38–126)
ALT: 23 U/L (ref 17–63)
AST: 31 U/L (ref 15–41)
Albumin: 2.9 g/dL — ABNORMAL LOW (ref 3.5–5.0)
Anion gap: 7 (ref 5–15)
BUN: 23 mg/dL — AB (ref 6–20)
CALCIUM: 8.2 mg/dL — AB (ref 8.9–10.3)
CO2: 35 mmol/L — AB (ref 22–32)
CREATININE: 1.41 mg/dL — AB (ref 0.61–1.24)
Chloride: 97 mmol/L — ABNORMAL LOW (ref 101–111)
GFR calc non Af Amer: 43 mL/min — ABNORMAL LOW (ref >60)
GFR, EST AFRICAN AMERICAN: 50 mL/min — AB (ref >60)
GLUCOSE: 153 mg/dL — AB (ref 65–99)
Potassium: 3.8 mmol/L (ref 3.5–5.1)
SODIUM: 139 mmol/L (ref 135–145)
Total Bilirubin: 0.9 mg/dL (ref 0.3–1.2)
Total Protein: 6 g/dL — ABNORMAL LOW (ref 6.5–8.1)

## 2015-06-20 LAB — URINE CULTURE: CULTURE: NO GROWTH

## 2015-06-20 LAB — TROPONIN I
Troponin I: 0.05 ng/mL — ABNORMAL HIGH (ref <0.031)
Troponin I: 0.06 ng/mL — ABNORMAL HIGH (ref <0.031)

## 2015-06-20 LAB — PROTIME-INR
INR: 5.45 (ref 0.00–1.49)
PROTHROMBIN TIME: 48 s — AB (ref 11.6–15.2)

## 2015-06-20 LAB — CBC
HCT: 28.5 % — ABNORMAL LOW (ref 39.0–52.0)
HEMOGLOBIN: 8.9 g/dL — AB (ref 13.0–17.0)
MCH: 29.7 pg (ref 26.0–34.0)
MCHC: 31.2 g/dL (ref 30.0–36.0)
MCV: 95 fL (ref 78.0–100.0)
Platelets: 249 10*3/uL (ref 150–400)
RBC: 3 MIL/uL — ABNORMAL LOW (ref 4.22–5.81)
RDW: 19.2 % — AB (ref 11.5–15.5)
WBC: 6.1 10*3/uL (ref 4.0–10.5)

## 2015-06-20 LAB — PROCALCITONIN

## 2015-06-20 MED ORDER — IPRATROPIUM-ALBUTEROL 0.5-2.5 (3) MG/3ML IN SOLN
3.0000 mL | Freq: Three times a day (TID) | RESPIRATORY_TRACT | Status: DC
  Administered 2015-06-20 – 2015-06-21 (×4): 3 mL via RESPIRATORY_TRACT
  Filled 2015-06-20 (×4): qty 3

## 2015-06-20 NOTE — Clinical Social Work Note (Signed)
Clinical Social Work Assessment  Patient Details  Name: [name] MRN: [id number] Date of Birth: 06/26/1924  Date of referral:  06/20/15               Reason for consult:  Discharge Planning                Permission sought to share information with:    Permission granted to share information::     Name::        Agency::     Relationship::     Contact Information:     Housing/Transportation Living arrangements for the past 2 months:  [place] of Information:  Adult Children Patient Interpreter Needed:  None Criminal Activity/Legal Involvement Pertinent to Current Situation/Hospitalization:  No - Comment as needed Significant Relationships:  Adult Children Lives with:  Facility Resident Do you feel safe going back to the place where you live?  Yes Need for family participation in patient care:  Yes (Comment)  Care giving concerns:  Pt is long term resident at [facility].    Social Worker assessment / plan:  CSW attempted to meet with pt at bedside. Pt sleeping soundly. CSW spoke with pt's daughter, [name]. Pt recently admitted and returned to [facility] at end of December. He returned yesterday due to increased SOB. Pt had been treated for pneumonia at facility. [name] lives nearby and is involved and supportive. She states that pt has declined since last admission. Per Lattie Haw at facility, pt has been resident since July 2015 and is nursing level of care. Since return from hospital, pt has required 2 person assist with ADLs and a mechanical lift for transfers. Okay for return.   Employment status:  Retired Nurse, adult PT Recommendations:  Not assessed at this time Information / Referral to community resources:  Other (Comment Required) (Return to [facility])  Patient/Family's Response to care:  [name] requests return to [facility] and is agreeable to [place] keeping facility updated.   Patient/Family's Understanding of and  Emotional Response to Diagnosis, Current Treatment, and Prognosis:  Pt's daughter is aware of admission diagnosis and requests attending speak with his cardiologist in [place]. MD notified. [name] reports that she feels pt is declining and is very weak and not wanting to eat. MD aware. CSW provided brief support. Will continue to follow.   Emotional Assessment Appearance:  Appears stated age Attitude/Demeanor/Rapport:  Unable to Assess Affect (typically observed):  Unable to Assess Orientation:    Alcohol / Substance use:  Not Applicable Psych involvement (Current and /or in the community):  No (Comment)  Discharge Needs  Concerns to be addressed:  Discharge Planning Concerns Readmission within the last 30 days:  Yes Current discharge risk:    Barriers to Discharge:  Continued Medical Work up   [employer], [employer], [place] 06/20/2015, 9:04 AM [phone]

## 2015-06-20 NOTE — Progress Notes (Signed)
PROGRESS NOTE  [name] [id number] DOB: [birth year] DOA: 06/19/2015 PCP: No primary care provider on file.  HPI/Recap of past 36 hours: 82 year old male with past medical history of stage III chronic kidney disease, diastolic heart failure, bipolar disorder and atrial fibrillation on chronic Coumadin who is from a nursing care facility and has had progressive decline over the past few months. In the past 2 weeks, started having more shortness of breath and poor by mouth intake and was brought into the emergency room. There he was shown to have signs of congestive heart failure, started on IV Lasix and admitted to the hospitalist service   Assessment/Plan: Active Problems:   Atrial fibrillation [facility]): Rate controlled. INR supratherapeutic, likely from decreased Coumadin clearance due to heart failure. Pharmacy dosing   Cardiac pacemaker in situ   DNR (do not resuscitate): Had discussion with daughter. She is considering hospice options seeing his overall decline.    Acute diastolic CHF (congestive heart failure) ([place]): Continue IV diuresis. Check pro calcitonin, which is normal. No evidence of infiltrate or pneumonia Coagulopathy: Secondary to CHF and decreased Coumadin clearance  Stage III chronic kidney disease: At baseline  Code Status: DO NOT RESUSCITATE  Family Communication: Spoke with daughter by phone   Disposition Plan: Potential discharge in the next 1-2 days.    Consultants:  Left message with cardiology   Procedures:  None   Antibiotics:  None   Objective: BP 108/52 mmHg  Pulse 78  Temp(Src) 97.7 F (36.5 C) (Oral)  Resp 18  Ht 6\' 4"  (1.93 m)  Wt 94.32 kg (207 lb 15 oz)  BMI 25.32 kg/m2  SpO2 100%  Intake/Output Summary (Last 24 hours) at 06/20/15 1518 Last data filed at 06/20/15 0612  Gross per 24 hour  Intake      0 ml  Output    850 ml  Net   -850 ml   Filed Weights   06/19/15 1441 06/19/15 2023  Weight: 95.255 kg (210 lb) 94.32 kg  (207 lb 15 oz)    Exam:   General: Somnolent  Cardiovascular: Irregular rhythm, rate controlled  Respiratory: Decreased breath sounds bibasilar  Abdomen: Soft, nontender, nondistended, hypoactive bowel sounds  Musculoskeletal: *No clubbing or cyanosis, trace pitting edema   Data Reviewed: Basic Metabolic Panel:  Recent Labs Lab 06/19/15 1513 06/20/15 0205  NA 141 139  K 3.8 3.8  CL 96* 97*  CO2 37* 35*  GLUCOSE 136* 153*  BUN 22* 23*  CREATININE 1.47* 1.41*  CALCIUM 8.9 8.2*   Liver Function Tests:  Recent Labs Lab 06/19/15 1513 06/20/15 0205  AST 37 31  ALT 25 23  ALKPHOS 43 34*  BILITOT 0.8 0.9  PROT 7.1 6.0*  ALBUMIN 3.6 2.9*   No results for input(s): LIPASE, AMYLASE in the last 168 hours. No results for input(s): AMMONIA in the last 168 hours. CBC:  Recent Labs Lab 06/19/15 1513 06/20/15 0205  WBC 10.5 6.1  NEUTROABS 8.3*  --   HGB 10.5* 8.9*  HCT 33.8* 28.5*  MCV 95.2 95.0  PLT 310 249   Cardiac Enzymes:    Recent Labs Lab 06/19/15 1513 06/19/15 1951 06/20/15 0205 06/20/15 0705  TROPONINI 0.06* 0.05* 0.05* 0.06*   BNP (last 3 results)  Recent Labs  06/19/15 1520  BNP 693.0*    ProBNP (last 3 results) No results for input(s): PROBNP in the last 8760 hours.  CBG: No results for input(s): GLUCAP in the last 168 hours.  Recent Results (from the  past 240 hour(s))  Blood Culture (routine x 2)     Status: None (Preliminary result)   Collection Time: 06/19/15  3:14 PM  Result Value Ref Range Status   Specimen Description RIGHT ANTECUBITAL  Final   Special Requests BOTTLES DRAWN AEROBIC ONLY 6CC  Final   Culture PENDING  Incomplete   Report Status PENDING  Incomplete  Blood Culture (routine x 2)     Status: None (Preliminary result)   Collection Time: 06/19/15  3:20 PM  Result Value Ref Range Status   Specimen Description BLOOD LEFT HAND  Final   Special Requests BOTTLES DRAWN AEROBIC AND ANAEROBIC 6CC  Final   Culture  PENDING  Incomplete   Report Status PENDING  Incomplete  Urine culture     Status: None (Preliminary result)   Collection Time: 06/19/15  4:00 PM  Result Value Ref Range Status   Specimen Description URINE, CATHETERIZED  Final   Special Requests NONE  Final   Culture   Final    NO GROWTH < 12 HOURS Performed at [facility]    Report Status PENDING  Incomplete     Studies: Dg Abd 2 Views  06/19/2015  CLINICAL DATA:  Abdominal distention EXAM: ABDOMEN - 2 VIEW COMPARISON:  05/29/2015 FINDINGS: There is gaseous distension of both the large and small bowel loops. The small bowel loops measure up to 3.1 cm. Findings may reflect ileus or partial distal bowel obstruction. IMPRESSION: 1. Gaseous distention of both large and small bowel loops which may reflect ileus or distal bowel obstruction. Electronically Signed   By: Kerby Moors M.D.   On: 06/19/2015 20:45    Scheduled Meds: . atorvastatin  40 mg Oral q1800  . furosemide  20 mg Intravenous Q12H  . gabapentin  100 mg Oral BID  . hydroxyurea  500 mg Oral Daily  . ipratropium-albuterol  3 mL Nebulization TID  . ketotifen  1 drop Both Eyes BID  . loratadine  10 mg Oral Daily  . metoprolol tartrate  25 mg Oral BID  . pantoprazole  40 mg Oral Daily  . risperiDONE  0.5 mg Oral Daily  . sodium chloride  3 mL Intravenous Q12H  . tamsulosin  0.4 mg Oral Daily  . Warfarin - Pharmacist Dosing Inpatient   Does not apply q1800    Continuous Infusions:    Time spent: 25 minutes  [place] [facility] Pager [phone] . If 7PM-7AM, please contact night-coverage at [email or web address], password [facility] 06/20/2015, 3:18 PM  LOS: 1 day

## 2015-06-20 NOTE — Telephone Encounter (Signed)
New problem   Pt's daughter [name] calling:  Pt is in hospital and daughter wants Dr Percival Spanish to look at his test results and advise what steps need to be taken next. Please call daughter

## 2015-06-20 NOTE — Care Management Note (Signed)
Case Management Note  Patient Details  Name: [name] MRN: [id number] Date of Birth: [birth year]  Subjective/Objective:                  Pt admitted from [facility] with CHF. Anticipate pt will discharge back to facility when medically stable.  Action/Plan: CSW is aware and will arrange discharge to facility when discharged.  Expected Discharge Date:                  Expected Discharge Plan:  Skilled Nursing Facility  In-House Referral:  Clinical Social Work  Discharge planning Services  CM Consult  Post Acute Care Choice:  NA Choice offered to:  NA  DME Arranged:    DME Agency:     HH Arranged:    [place] Agency:     Status of Service:  Completed, signed off  Medicare Important Message Given:    Date Medicare IM Given:    Medicare IM give by:    Date Additional Medicare IM Given:    Additional Medicare Important Message give by:     If discussed at [place] of Stay Meetings, dates discussed:    Additional Comments:  Joylene Draft, RN 06/20/2015, 9:07 AM

## 2015-06-20 NOTE — Progress Notes (Signed)
Dr. Susanne Borders had been notified in regards to INR 5.45. Order received to stop Lovenox.

## 2015-06-20 NOTE — Progress Notes (Signed)
ANTICOAGULATION CONSULT NOTE - follow up  Pharmacy Consult for coumadin Indication: atrial fibrillation  Allergies  Allergen Reactions  . Abilify [Aripiprazole]     Other reaction(s): Other (See Comments) Hallucinations  . Demerol     unknown  . Meperidine     Other reaction(s): Mental Status Changes (intolerance)  . Morphine Other (See Comments)    Hallucinations  . Morphine And Related     Hallucinations  . Risperdal [Risperidone] Other (See Comments)    Weakness, tiredness   Patient Measurements: Height: 6\' 4"  (193 cm) Weight: 207 lb 15 oz (94.32 kg) IBW/kg (Calculated) : 86.8  Vital Signs: Temp: 97 F (36.1 C) (01/11 0611) Temp Source: Oral (01/11 0611) BP: 115/48 mmHg (01/11 0916) Pulse Rate: 77 (01/11 0916)  Labs:  Recent Labs  06/19/15 1513 06/19/15 1951 06/20/15 0205 06/20/15 0705  HGB 10.5*  --  8.9*  --   HCT 33.8*  --  28.5*  --   PLT 310  --  249  --   LABPROT 41.9*  --  48.0*  --   INR 4.55*  --  5.45*  --   CREATININE 1.47*  --  1.41*  --   TROPONINI 0.06* 0.05* 0.05* 0.06*    Estimated Creatinine Clearance: 44.5 mL/min (by C-G formula based on Cr of 1.41).   Medical History: Past Medical History  Diagnosis Date  . Anemia   . Chronic constipation   . Colonic neoplasm   . Occult blood positive stool   . A-fib ([place])   . Hypertrophic cardiomyopathy ([place])   . Bipolar disorder ([place])   . Diastolic heart failure   . Pacemaker     [facility] and [facility]  . Depression   . Chronic kidney disease   . Thrombocytosis ([place])   . CKD (chronic kidney disease) stage 3, GFR 30-59 ml/min 03/16/2013  . CHF (congestive heart failure) ([place])   . Aphasia   . Cancer (HCC)     squamous cell  . Bipolar 1 disorder ([place])   . MDD (major depressive disorder) ([place])   . Insomnia   . Hypertension   . BPH (benign prostatic hyperplasia)   . Dysphagia   . GERD (gastroesophageal reflux disease)   . Angina at rest [facility])   . Hyperosmolality and  hypernatremia     Medications:  Prescriptions prior to admission  Medication Sig Dispense Refill Last Dose  . acetaminophen (TYLENOL) 650 MG CR tablet Take 650 mg by mouth 3 (three) times daily as needed for pain. TYLENOL ARTHRITIS   Past Week at Unknown time  . atorvastatin (LIPITOR) 40 MG tablet Take 1 tablet (40 mg total) by mouth daily at 6 PM. 30 tablet 5 06/19/2015 at Unknown time  . beta carotene w/minerals (OCUVITE) tablet Take 1 tablet by mouth daily.   06/19/2015 at Unknown time  . Cholecalciferol (VITAMIN D3) 50000 UNITS CAPS Take 50,000 Units by mouth every 30 (thirty) days.   unknown  . esomeprazole (NEXIUM) 20 MG capsule Take 20 mg by mouth daily at 12 noon.   06/18/2015 at Unknown time  . fenofibrate 160 MG tablet Take 1 tablet (160 mg total) by mouth daily. 30 tablet 5 06/19/2015 at Unknown time  . fexofenadine (ALLEGRA) 60 MG tablet Take 60 mg by mouth daily.   06/19/2015 at Unknown time  . furosemide (LASIX) 20 MG tablet Take 80 mg by mouth 2 (two) times daily.    06/18/2015 at Unknown time  . gabapentin (NEURONTIN) 100 MG  capsule Take 100 mg by mouth 2 (two) times daily.    06/19/2015 at Unknown time  . guaiFENesin (ROBITUSSIN) 100 MG/5ML SOLN Take 10 mLs by mouth every 4 (four) hours as needed for cough or to loosen phlegm.   Past Week at Unknown time  . hydroxyurea (HYDREA) 500 MG capsule Take 500 mg by mouth daily. May take with food to minimize GI side effects.   06/19/2015 at Unknown time  . ipratropium-albuterol (DUONEB) 0.5-2.5 (3) MG/3ML SOLN Take 3 mLs by nebulization every 6 (six) hours as needed (congestion).   06/19/2015 at Unknown time  . ketotifen (ZADITOR) 0.025 % ophthalmic solution Place 1 drop into both eyes 2 (two) times daily.   06/19/2015 at Unknown time  . LORazepam (ATIVAN) 0.5 MG tablet Take 0.5 mg by mouth every 6 (six) hours as needed for anxiety.   06/19/2015 at Unknown time  . metoprolol tartrate (LOPRESSOR) 25 MG tablet Take 25 mg by mouth 2 (two) times daily.    06/19/2015 at 0900  . nitroGLYCERIN (NITRODUR - DOSED IN MG/24 HR) 0.2 mg/hr Place 1 patch onto the skin daily.   06/19/2015 at Unknown time  . polyethylene glycol powder (GLYCOLAX/MIRALAX) powder Take 1 Container by mouth once.   06/19/2015 at Unknown time  . potassium chloride (MICRO-K) 10 MEQ CR capsule Take 20 mEq by mouth 3 (three) times daily with meals.   06/19/2015 at Unknown time  . risperiDONE (RISPERDAL) 1 MG tablet Take 0.5 mg by mouth daily. 0.5mg  by mouth in the am and 2 tablets in the evening   06/19/2015 at Unknown time  . tamsulosin (FLOMAX) 0.4 MG CAPS capsule Take 0.4 mg by mouth daily.    06/19/2015 at Unknown time  . vitamin B-12 (CYANOCOBALAMIN) 1000 MCG tablet Take 1,000 mcg by mouth daily.     06/19/2015 at Unknown time  . senna (SENOKOT) 8.6 MG TABS Take 1 tablet by mouth at bedtime.    05/28/2015 at 2100  . simethicone (MYTAB GAS) 80 MG chewable tablet Chew 160 mg by mouth once as needed for flatulence (bloating).   unknown    Assessment: 82 yo male presents with SOB. He has been coughing and gradually getting worse over last 2 weeks. Pt is chronically anticoagulated for Afib. INR is supratherapeutic  Goal of Therapy:  INR 2-3 Monitor platelets by anticoagulation protocol: Yes   Plan:  No coumadin today Monitor for S/S of bleeding PT/INR daily  Hart Robinsons Pharm D Clinical Pharmacist Hart Robinsons A 06/20/2015,10:57 AM

## 2015-06-20 NOTE — NC FL2 (Signed)
[place] LEVEL OF CARE SCREENING TOOL     IDENTIFICATION  Patient Name: [name] Birthdate: 1925-01-02 Sex: male Admission Date (Current Location): 06/19/2015  [place] and [place] Number:  [place] [id number] [place] and Address:  [facility] [address], [place]      Provider Number: [id number]  Attending Physician Name and Address:  Annita Brod, MD  Relative Name and Phone Number:       Current Level of Care: Hospital Recommended Level of Care: [place] Prior Approval Number:    Date Approved/Denied:   PASRR Number: [id number]  Discharge Plan: SNF    Current Diagnoses: Patient Active Problem List   Diagnosis Date Noted  . CHF (congestive heart failure) ([place]) 06/19/2015  . Acute diastolic CHF (congestive heart failure) ([place]) 06/19/2015  . Small bowel obstruction ([place]) 05/29/2015  . Hypokalemia 05/29/2015  . Vomiting 05/29/2015  . DNR (do not resuscitate) 05/29/2015  . HTN (hypertension) 05/29/2015  . BPH (benign prostatic hyperplasia) 05/29/2015  . GERD (gastroesophageal reflux disease) 05/29/2015  . HLD (hyperlipidemia) 05/29/2015  . Chronic diastolic congestive heart failure ([place]) 05/29/2015  . Dyspnea 03/23/2013  . Left shoulder pain 03/21/2013  . CKD (chronic kidney disease) stage 3, GFR 30-59 ml/min 03/16/2013  . Chest pain with low risk for cardiac etiology- low risk stress test; likely musculoskeltal  03/15/2013  . Chest pain 03/14/2013  . Shortness of breath 03/14/2013  . CKD (chronic kidney disease) 01/04/2013  . Anemia 01/04/2013  . Hypertrophic cardiomyopathy ([place]) 01/04/2013  . Bipolar disorder, unspecified ([place]) 01/04/2013  . Diastolic heart failure ([place]) 01/04/2013  . Cardiac pacemaker in situ 01/04/2013  . Thrombocytosis ([employer]) 01/04/2013  . Depression 01/04/2013  . Long term (current) use of anticoagulants 10/06/2012  . Atrial fibrillation ([place]) 10/05/2012  . ACL tear  10/05/2012    Orientation RESPIRATION BLADDER Height & Weight    Self  O2 (4L) Incontinent 6\' 4"  (193 cm) 207 lbs.  BEHAVIORAL SYMPTOMS/MOOD NEUROLOGICAL BOWEL NUTRITION STATUS  Other (Comment) (n/a)  (n/a) Incontinent Diet (heart healthy)  AMBULATORY STATUS COMMUNICATION OF NEEDS Skin   Total Care Verbally Normal                       Personal Care Assistance Level of Assistance    Bathing Assistance: Maximum assistance Feeding assistance: Maximum assistance Dressing Assistance: Maximum assistance     Functional Limitations Info  Sight, Hearing, Speech Sight Info: Adequate Hearing Info: Adequate Speech Info: Adequate    SPECIAL CARE FACTORS FREQUENCY                       Contractures      Additional Factors Info  Psychotropic Code Status Info: DNR Allergies Info: Abilify, Demerol, Meperidine, Morphine, Morphine and Related, Risperdal Psychotropic Info: Ativan         Current Medications (06/20/2015):  This is the current hospital active medication list Current Facility-Administered Medications  Medication Dose Route Frequency Provider Last Rate Last Dose  . 0.9 %  sodium chloride infusion  250 mL Intravenous PRN Oswald Hillock, MD      . acetaminophen (TYLENOL) tablet 650 mg  650 mg Oral Q6H PRN Oswald Hillock, MD       Or  . acetaminophen (TYLENOL) suppository 650 mg  650 mg Rectal Q6H PRN Oswald Hillock, MD      . atorvastatin (LIPITOR) tablet 40 mg  40 mg Oral q1800  Oswald Hillock, MD      . furosemide (LASIX) injection 20 mg  20 mg Intravenous Q12H Oswald Hillock, MD   20 mg at 06/19/15 2216  . gabapentin (NEURONTIN) capsule 100 mg  100 mg Oral BID Oswald Hillock, MD   100 mg at 06/19/15 2143  . hydroxyurea (HYDREA) capsule 500 mg  500 mg Oral Daily Oswald Hillock, MD      . ipratropium-albuterol (DUONEB) 0.5-2.5 (3) MG/3ML nebulizer solution 3 mL  3 mL Nebulization TID Oswald Hillock, MD   3 mL at 06/20/15 0817  . ketotifen (ZADITOR) 0.025 % ophthalmic solution  1 drop  1 drop Both Eyes BID Oswald Hillock, MD   1 drop at 06/19/15 2155  . loratadine (CLARITIN) tablet 10 mg  10 mg Oral Daily Oswald Hillock, MD      . LORazepam (ATIVAN) tablet 0.5 mg  0.5 mg Oral Q6H PRN Oswald Hillock, MD   0.5 mg at 06/19/15 2227  . metoprolol tartrate (LOPRESSOR) tablet 25 mg  25 mg Oral BID Oswald Hillock, MD   25 mg at 06/19/15 2222  . ondansetron (ZOFRAN) tablet 4 mg  4 mg Oral Q6H PRN Oswald Hillock, MD       Or  . ondansetron (ZOFRAN) injection 4 mg  4 mg Intravenous Q6H PRN Oswald Hillock, MD      . pantoprazole (PROTONIX) EC tablet 40 mg  40 mg Oral Daily Oswald Hillock, MD   40 mg at 06/19/15 2222  . risperiDONE (RISPERDAL) tablet 0.5 mg  0.5 mg Oral Daily Oswald Hillock, MD      . sodium chloride 0.9 % injection 3 mL  3 mL Intravenous Q12H Oswald Hillock, MD   3 mL at 06/19/15 2216  . sodium chloride 0.9 % injection 3 mL  3 mL Intravenous PRN Oswald Hillock, MD      . tamsulosin (FLOMAX) capsule 0.4 mg  0.4 mg Oral Daily Oswald Hillock, MD      . Warfarin - Pharmacist Dosing Inpatient   Does not apply [id number] Oswald Hillock, MD         Discharge Medications: Please see discharge summary for a list of discharge medications.  Relevant Imaging Results:  Relevant Lab Results:   Additional Information    Salome Arnt, [place]

## 2015-06-21 DIAGNOSIS — N179 Acute kidney failure, unspecified: Secondary | ICD-10-CM

## 2015-06-21 DIAGNOSIS — I5031 Acute diastolic (congestive) heart failure: Secondary | ICD-10-CM | POA: Diagnosis not present

## 2015-06-21 DIAGNOSIS — I482 Chronic atrial fibrillation: Secondary | ICD-10-CM | POA: Diagnosis not present

## 2015-06-21 DIAGNOSIS — I5021 Acute systolic (congestive) heart failure: Secondary | ICD-10-CM | POA: Diagnosis not present

## 2015-06-21 DIAGNOSIS — N183 Chronic kidney disease, stage 3 (moderate): Secondary | ICD-10-CM | POA: Diagnosis not present

## 2015-06-21 LAB — BASIC METABOLIC PANEL
Anion gap: 9 (ref 5–15)
BUN: 34 mg/dL — AB (ref 6–20)
CALCIUM: 8.5 mg/dL — AB (ref 8.9–10.3)
CHLORIDE: 97 mmol/L — AB (ref 101–111)
CO2: 35 mmol/L — AB (ref 22–32)
CREATININE: 1.89 mg/dL — AB (ref 0.61–1.24)
GFR calc non Af Amer: 30 mL/min — ABNORMAL LOW (ref >60)
GFR, EST AFRICAN AMERICAN: 35 mL/min — AB (ref >60)
GLUCOSE: 135 mg/dL — AB (ref 65–99)
Potassium: 4.3 mmol/L (ref 3.5–5.1)
Sodium: 141 mmol/L (ref 135–145)

## 2015-06-21 LAB — CBC
HEMATOCRIT: 31.7 % — AB (ref 39.0–52.0)
Hemoglobin: 9.8 g/dL — ABNORMAL LOW (ref 13.0–17.0)
MCH: 29.8 pg (ref 26.0–34.0)
MCHC: 30.9 g/dL (ref 30.0–36.0)
MCV: 96.4 fL (ref 78.0–100.0)
Platelets: 288 10*3/uL (ref 150–400)
RBC: 3.29 MIL/uL — ABNORMAL LOW (ref 4.22–5.81)
RDW: 19.2 % — AB (ref 11.5–15.5)
WBC: 12 10*3/uL — ABNORMAL HIGH (ref 4.0–10.5)

## 2015-06-21 LAB — PROTIME-INR
INR: 4.62 — AB (ref 0.00–1.49)
Prothrombin Time: 42.3 seconds — ABNORMAL HIGH (ref 11.6–15.2)

## 2015-06-21 LAB — BRAIN NATRIURETIC PEPTIDE: B Natriuretic Peptide: 671 pg/mL — ABNORMAL HIGH (ref 0.0–100.0)

## 2015-06-21 LAB — MRSA PCR SCREENING: MRSA by PCR: NEGATIVE

## 2015-06-21 MED ORDER — POLYVINYL ALCOHOL 1.4 % OP SOLN: 1.0000 [drp] | Freq: Four times a day (QID) | OPHTHALMIC | Status: DC | PRN

## 2015-06-21 MED ORDER — HALOPERIDOL LACTATE 2 MG/ML PO CONC
0.5000 mg | ORAL | Status: DC | PRN
  Filled 2015-06-21: qty 0.3

## 2015-06-21 MED ORDER — MORPHINE SULFATE (CONCENTRATE) 10 MG/0.5ML PO SOLN
5.0000 mg | ORAL | Status: DC | PRN
  Filled 2015-06-21: qty 0.5

## 2015-06-21 MED ORDER — GLYCOPYRROLATE 0.2 MG/ML IJ SOLN
0.2000 mg | INTRAMUSCULAR | Status: DC | PRN
  Filled 2015-06-21: qty 1

## 2015-06-21 MED ORDER — HALOPERIDOL LACTATE 5 MG/ML IJ SOLN: 0.5000 mg | INTRAMUSCULAR | Status: DC | PRN

## 2015-06-21 MED ORDER — ONDANSETRON HCL 4 MG/2ML IJ SOLN: 4.0000 mg | Freq: Four times a day (QID) | INTRAMUSCULAR | Status: DC | PRN

## 2015-06-21 MED ORDER — HALOPERIDOL 0.5 MG PO TABS: 0.5000 mg | ORAL_TABLET | ORAL | Status: AC | PRN

## 2015-06-21 MED ORDER — SODIUM CHLORIDE 0.9 % IJ SOLN: 3.0000 mL | INTRAMUSCULAR | Status: DC | PRN

## 2015-06-21 MED ORDER — BIOTENE DRY MOUTH MT LIQD: 15.0000 mL | OROMUCOSAL | Status: DC | PRN

## 2015-06-21 MED ORDER — HALOPERIDOL 0.5 MG PO TABS
0.5000 mg | ORAL_TABLET | ORAL | Status: DC | PRN
  Filled 2015-06-21: qty 1

## 2015-06-21 MED ORDER — GLYCOPYRROLATE 1 MG PO TABS
1.0000 mg | ORAL_TABLET | ORAL | Status: DC | PRN
  Filled 2015-06-21: qty 1

## 2015-06-21 MED ORDER — ONDANSETRON 4 MG PO TBDP: 4.0000 mg | ORAL_TABLET | Freq: Four times a day (QID) | ORAL | Status: AC | PRN

## 2015-06-21 MED ORDER — SODIUM CHLORIDE 0.9 % IJ SOLN: 3.0000 mL | Freq: Two times a day (BID) | INTRAMUSCULAR | Status: DC

## 2015-06-21 MED ORDER — MORPHINE SULFATE (CONCENTRATE) 10 MG/0.5ML PO SOLN
5.0000 mg | ORAL | Status: DC | PRN
  Administered 2015-06-21: 5 mg via SUBLINGUAL

## 2015-06-21 MED ORDER — ONDANSETRON 4 MG PO TBDP: 4.0000 mg | ORAL_TABLET | Freq: Four times a day (QID) | ORAL | Status: DC | PRN

## 2015-06-21 MED ORDER — POLYVINYL ALCOHOL 1.4 % OP SOLN: 1.0000 [drp] | Freq: Four times a day (QID) | OPHTHALMIC | Status: AC | PRN

## 2015-06-21 MED ORDER — MORPHINE SULFATE (CONCENTRATE) 10 MG/0.5ML PO SOLN: 5.0000 mg | ORAL | Status: AC | PRN

## 2015-06-21 MED ORDER — GLYCOPYRROLATE 1 MG PO TABS: 1.0000 mg | ORAL_TABLET | ORAL | Status: AC | PRN

## 2015-06-21 NOTE — Progress Notes (Signed)
ANTICOAGULATION CONSULT NOTE - follow up  Pharmacy Consult for coumadin Indication: atrial fibrillation  Allergies  Allergen Reactions  . Abilify [Aripiprazole]     Other reaction(s): Other (See Comments) Hallucinations  . Demerol     unknown  . Meperidine     Other reaction(s): Mental Status Changes (intolerance)  . Morphine Other (See Comments)    Hallucinations  . Morphine And Related     Hallucinations  . Risperdal [Risperidone] Other (See Comments)    Weakness, tiredness   Patient Measurements: Height: 6\' 4"  (193 cm) Weight: 207 lb 15 oz (94.32 kg) IBW/kg (Calculated) : 86.8  Vital Signs: Temp: 98.9 F (37.2 C) (01/12 0501) Temp Source: Oral (01/12 0501) BP: 112/53 mmHg (01/12 0501) Pulse Rate: 88 (01/12 0847)  Labs:  Recent Labs  06/19/15 1513 06/19/15 1951 06/20/15 0205 06/20/15 0705 06/21/15 0648  HGB 10.5*  --  8.9*  --  9.8*  HCT 33.8*  --  28.5*  --  31.7*  PLT 310  --  249  --  288  LABPROT 41.9*  --  48.0*  --  42.3*  INR 4.55*  --  5.45*  --  4.62*  CREATININE 1.47*  --  1.41*  --  1.89*  TROPONINI 0.06* 0.05* 0.05* 0.06*  --     Estimated Creatinine Clearance: 33.2 mL/min (by C-G formula based on Cr of 1.89).   Medical History: Past Medical History  Diagnosis Date  . Anemia   . Chronic constipation   . Colonic neoplasm   . Occult blood positive stool   . A-fib ([place])   . Hypertrophic cardiomyopathy ([place])   . Bipolar disorder ([place])   . Diastolic heart failure   . Pacemaker     [facility]  . Depression   . Chronic kidney disease   . Thrombocytosis ([place])   . CKD (chronic kidney disease) stage 3, GFR 30-59 ml/min 03/16/2013  . CHF (congestive heart failure) ([place])   . Aphasia   . Cancer (HCC)     squamous cell  . Bipolar 1 disorder ([place])   . MDD (major depressive disorder) ([place])   . Insomnia   . Hypertension   . BPH (benign prostatic hyperplasia)   . Dysphagia   . GERD (gastroesophageal reflux disease)   .  Angina at rest [facility])   . Hyperosmolality and hypernatremia     Medications:  Prescriptions prior to admission  Medication Sig Dispense Refill Last Dose  . acetaminophen (TYLENOL) 650 MG CR tablet Take 650 mg by mouth 3 (three) times daily as needed for pain. TYLENOL ARTHRITIS   Past Week at Unknown time  . atorvastatin (LIPITOR) 40 MG tablet Take 1 tablet (40 mg total) by mouth daily at 6 PM. 30 tablet 5 06/19/2015 at Unknown time  . beta carotene w/minerals (OCUVITE) tablet Take 1 tablet by mouth daily.   06/19/2015 at Unknown time  . Cholecalciferol (VITAMIN D3) 50000 UNITS CAPS Take 50,000 Units by mouth every 30 (thirty) days.   unknown  . esomeprazole (NEXIUM) 20 MG capsule Take 20 mg by mouth daily at 12 noon.   06/18/2015 at Unknown time  . fenofibrate 160 MG tablet Take 1 tablet (160 mg total) by mouth daily. 30 tablet 5 06/19/2015 at Unknown time  . fexofenadine (ALLEGRA) 60 MG tablet Take 60 mg by mouth daily.   06/19/2015 at Unknown time  . furosemide (LASIX) 20 MG tablet Take 80 mg by mouth 2 (two) times daily.  06/18/2015 at Unknown time  . gabapentin (NEURONTIN) 100 MG capsule Take 100 mg by mouth 2 (two) times daily.    06/19/2015 at Unknown time  . guaiFENesin (ROBITUSSIN) 100 MG/5ML SOLN Take 10 mLs by mouth every 4 (four) hours as needed for cough or to loosen phlegm.   Past Week at Unknown time  . hydroxyurea (HYDREA) 500 MG capsule Take 500 mg by mouth daily. May take with food to minimize GI side effects.   06/19/2015 at Unknown time  . ipratropium-albuterol (DUONEB) 0.5-2.5 (3) MG/3ML SOLN Take 3 mLs by nebulization every 6 (six) hours as needed (congestion).   06/19/2015 at Unknown time  . ketotifen (ZADITOR) 0.025 % ophthalmic solution Place 1 drop into both eyes 2 (two) times daily.   06/19/2015 at Unknown time  . LORazepam (ATIVAN) 0.5 MG tablet Take 0.5 mg by mouth every 6 (six) hours as needed for anxiety.   06/19/2015 at Unknown time  . metoprolol tartrate (LOPRESSOR) 25 MG  tablet Take 25 mg by mouth 2 (two) times daily.   06/19/2015 at 0900  . nitroGLYCERIN (NITRODUR - DOSED IN MG/24 HR) 0.2 mg/hr Place 1 patch onto the skin daily.   06/19/2015 at Unknown time  . polyethylene glycol powder (GLYCOLAX/MIRALAX) powder Take 1 Container by mouth once.   06/19/2015 at Unknown time  . potassium chloride (MICRO-K) 10 MEQ CR capsule Take 20 mEq by mouth 3 (three) times daily with meals.   06/19/2015 at Unknown time  . risperiDONE (RISPERDAL) 1 MG tablet Take 0.5 mg by mouth daily. 0.5mg  by mouth in the am and 2 tablets in the evening   06/19/2015 at Unknown time  . tamsulosin (FLOMAX) 0.4 MG CAPS capsule Take 0.4 mg by mouth daily.    06/19/2015 at Unknown time  . vitamin B-12 (CYANOCOBALAMIN) 1000 MCG tablet Take 1,000 mcg by mouth daily.     06/19/2015 at Unknown time  . senna (SENOKOT) 8.6 MG TABS Take 1 tablet by mouth at bedtime.    05/28/2015 at 2100  . simethicone (MYTAB GAS) 80 MG chewable tablet Chew 160 mg by mouth once as needed for flatulence (bloating).   unknown    Assessment: 82 yo male presents with SOB. He has been coughing and gradually getting worse over last 2 weeks. Pt is chronically anticoagulated for Afib. INR remains supratherapeutic  Goal of Therapy:  INR 2-3 Monitor platelets by anticoagulation protocol: Yes   Plan:  No coumadin today Monitor for S/S of bleeding PT/INR daily  Thanks for allowing pharmacy to be a part of this patient's care.  Excell Seltzer, PharmD Clinical Pharmacist 06/21/2015,8:57 AM

## 2015-06-21 NOTE — Care Management Note (Signed)
Case Management Note  Patient Details  Name: [name] MRN: [id number] Date of Birth: 1924/12/25  Subjective/Objective:                    Action/Plan:   Expected Discharge Date:                  Expected Discharge Plan:  [place]  In-House Referral:  Clinical Social Work  Discharge planning Services  CM Consult  Post Acute Care Choice:  Hospice Choice offered to:  Adult Children, HC POA / Guardian  DME Arranged:    DME Agency:     HH Arranged:    HH Agency:     Status of Service:  Completed, signed off  Medicare Important Message Given:    Date Medicare IM Given:    Medicare IM give by:    Date Additional Medicare IM Given:    Additional Medicare Important Message give by:     If discussed at [place] of Stay Meetings, dates discussed:    Additional Comments: Pt has elected for full comfort care and would like inpatient hospice facility. CSW has arranged discharge to [facility] today. Christinia Gully [place], RN 06/21/2015, 11:49 AM

## 2015-06-21 NOTE — Clinical Social Work Note (Addendum)
Bed available at [facility] today. CSW discussed with [name] who accepts and will complete paperwork this afternoon at facility. Pt will transfer via [facility]. Out of facility DNR sent with pt. [name] has notified [employer] of plans.   [name], [place]

## 2015-06-21 NOTE — Clinical Social Work Note (Signed)
CSW spoke with pt's daughter, [name] regarding hospice following MD discussion. [name] is interested in [facility] if bed available, but MD also feels pt would be [facility] appropriate. Hospice aware and referral faxed.   Benay Pike, [place]

## 2015-06-21 NOTE — Progress Notes (Signed)
Discharged to [facility], condition stable on 4L oxygen, out via stretcher with EMS, family at side.

## 2015-06-21 NOTE — Discharge Summary (Signed)
Discharge Summary  [name] [id number] DOB: 1924/09/27  PCP: No primary care provider on file.  Admit date: 06/19/2015 Discharge date: 06/21/2015  Time spent: 25 minutes  Recommendations for Outpatient Follow-up:  1. Patient is being made comfort care and being discharged to hospice facility All previous home medications are being discontinued with the exception of Risperdal daily 2. New medications: When necessary Zofran, when necessary Roxanol, when necessary Haldol, when necessary Robinul  Discharge Diagnoses:  Active Hospital Problems   Diagnosis Date Noted  . CHF (congestive heart failure) ([place]) 06/19/2015  . Acute diastolic CHF (congestive heart failure) ([place]) 06/19/2015  . DNR (do not resuscitate) 05/29/2015  . Cardiac pacemaker in situ 01/04/2013  . Atrial fibrillation ([place]) 10/05/2012    Resolved Hospital Problems   Diagnosis Date Noted Date Resolved  No resolved problems to display.    Discharge Condition:  Patient is being discharged to hospice facility. I suspect he likely will pass within the next few weeks  Diet recommendation:  Comfort feeds only   Filed Weights   06/19/15 1441 06/19/15 2023  Weight: 95.255 kg (210 lb) 94.32 kg (207 lb 15 oz)    History of present illness:  82 year old male with past medical history of stage III chronic kidney disease, diastolic heart failure, bipolar disorder and atrial fibrillation on chronic Coumadin who is from a nursing care facility and has had progressive decline over the past few months. In the past 2 weeks, started having more shortness of breath and poor by mouth intake and was brought into the emergency room. There he was shown to have signs of congestive heart failure, started on IV Lasix and admitted to the hospitalist service. On admission, his INR was supratherapeutic at 5.45 Following diuresis, he diuresed over 1.5 L. However, his renal function worsened and his overall condition appeared to slowly worsen.  I discussed this with the patient's daughter and she stated that he has had a decline over the last 3-6 months prior to the 2 weeks from this hospitalization. With little improvement despite diuresis, patient's daughter decided to make the patient comfort care after she had a discussion with him when he was lucid about this earlier. Hospice facility was available on 1/12.  Hospital Course:  Active Problems:   Atrial fibrillation [facility]): On chronic Coumadin. Chads 2 vasc score of 6.  Rate controlled   Cardiac pacemaker in situ   DNR (do not resuscitate) Acute kidney injury in the setting of stage III chronic kidney disease: With Lasix, patient did diurese, although renal function worsened. Likely in the setting of third spacing secondary to poor by mouth intake.    Acute diastolic CHF (congestive heart failure) ([place]): Patient started on IV Lasix and diuresed over 1.5 L. With his overall decline, is daughter opted for putting him on hospice care and this was stopped.   acute kidney injury   coagulopathy: Increased INR secondary to hepatic congestion brought on by heart failure. Coumadin initially held and INR trending downward as patient was diuresed. Coumadin discontinued now that he is comfort care   Procedures:   None  Consultations:   None  Discharge Exam: BP 112/53 mmHg  Pulse 77  Temp(Src) 97.9 F (36.6 C) (Oral)  Resp 22  Ht 6\' 4"  (1.93 m)  Wt 94.32 kg (207 lb 15 oz)  BMI 25.32 kg/m2  SpO2 92%  General:  Intermittent confusion  Cardiovascular:  Irregular rhythm, rate controlled Respiratory: coarse breath sounds  Discharge Instructions You were cared for by  a hospitalist during your hospital stay. If you have any questions about your discharge medications or the care you received while you were in the hospital after you are discharged, you can call the unit and asked to speak with the hospitalist on call if the hospitalist that took care of you is not available. Once you  are discharged, your primary care physician will handle any further medical issues. Please note that NO REFILLS for any discharge medications will be authorized once you are discharged, as it is imperative that you return to your primary care physician (or establish a relationship with a primary care physician if you do not have one) for your aftercare needs so that they can reassess your need for medications and monitor your lab values.     Medication List    STOP taking these medications        atorvastatin 40 MG tablet  Commonly known as:  LIPITOR     beta carotene w/minerals tablet     esomeprazole 20 MG capsule  Commonly known as:  NEXIUM     fenofibrate 160 MG tablet     fexofenadine 60 MG tablet  Commonly known as:  ALLEGRA     furosemide 20 MG tablet  Commonly known as:  LASIX     gabapentin 100 MG capsule  Commonly known as:  NEURONTIN     guaiFENesin 100 MG/5ML Soln  Commonly known as:  ROBITUSSIN     hydroxyurea 500 MG capsule  Commonly known as:  HYDREA     ipratropium-albuterol 0.5-2.5 (3) MG/3ML Soln  Commonly known as:  DUONEB     ketotifen 0.025 % ophthalmic solution  Commonly known as:  ZADITOR     LORazepam 0.5 MG tablet  Commonly known as:  ATIVAN     metoprolol tartrate 25 MG tablet  Commonly known as:  LOPRESSOR     MYTAB GAS 80 MG chewable tablet  Generic drug:  simethicone     nitroGLYCERIN 0.2 mg/hr patch  Commonly known as:  NITRODUR - Dosed in mg/24 hr     polyethylene glycol powder powder  Commonly known as:  GLYCOLAX/MIRALAX     potassium chloride 10 MEQ CR capsule  Commonly known as:  MICRO-K     senna 8.6 MG Tabs tablet  Commonly known as:  SENOKOT     tamsulosin 0.4 MG Caps capsule  Commonly known as:  FLOMAX     vitamin B-12 1000 MCG tablet  Commonly known as:  CYANOCOBALAMIN     Vitamin D3 50000 units Caps      TAKE these medications        acetaminophen 650 MG CR tablet  Commonly known as:  TYLENOL  Take 650 mg  by mouth 3 (three) times daily as needed for pain. TYLENOL ARTHRITIS     glycopyrrolate 1 MG tablet  Commonly known as:  ROBINUL  Take 1 tablet (1 mg total) by mouth every 4 (four) hours as needed (excessive secretions).     haloperidol 0.5 MG tablet  Commonly known as:  HALDOL  Take 1 tablet (0.5 mg total) by mouth every 4 (four) hours as needed for agitation (or delirium).     morphine CONCENTRATE 10 MG/0.5ML Soln concentrated solution  Take 0.25 mLs (5 mg total) by mouth every 2 (two) hours as needed for moderate pain (or dyspnea).     ondansetron 4 MG disintegrating tablet  Commonly known as:  ZOFRAN-ODT  Take 1 tablet (4 mg total) by mouth  every 6 (six) hours as needed for nausea.     polyvinyl alcohol 1.4 % ophthalmic solution  Commonly known as:  LIQUIFILM TEARS  Place 1 drop into both eyes 4 (four) times daily as needed for dry eyes.     risperiDONE 1 MG tablet  Commonly known as:  RISPERDAL  Take 0.5 mg by mouth daily. 0.5mg  by mouth in the am and 2 tablets in the evening       Allergies  Allergen Reactions  . Abilify [Aripiprazole]     Other reaction(s): Other (See Comments) Hallucinations  . Demerol     unknown  . Meperidine     Other reaction(s): Mental Status Changes (intolerance)  . Morphine Other (See Comments)    Hallucinations  . Morphine And Related     Hallucinations  . Risperdal [Risperidone] Other (See Comments)    Weakness, tiredness      The results of significant diagnostics from this hospitalization (including imaging, microbiology, ancillary and laboratory) are listed below for reference.    Significant Diagnostic Studies: Ct Abdomen Pelvis Wo Contrast  05/29/2015  CLINICAL DATA:  Abdominal pain, distention and vomiting for 3 days. History of colon cancer. Initial encounter. EXAM: CT ABDOMEN AND PELVIS WITHOUT CONTRAST TECHNIQUE: Multidetector CT imaging of the abdomen and pelvis was performed following the standard protocol without IV  contrast. COMPARISON:  None. FINDINGS: There are small bilateral pleural effusions, greater on the left, with some associated basilar atelectasis. Cardiomegaly is noted. No pericardial effusion. A few stones are seen in a decompressed gallbladder. There is no evidence of cholecystitis. A 1.0 cm in diameter hypoattenuating lesion is seen in the inferior right hepatic lobe on image 29 with density units of 5.2 most consistent with a cyst. The liver is otherwise unremarkable. The spleen, adrenal glands, pancreas and biliary tree are unremarkable. The kidneys are atrophic bilaterally. Small left renal cyst is incidentally noted. The stomach is somewhat distended and there is mild distention of the proximal jejunum up to approximately 4.1 cm transition point appears to lie in the left upper quadrant the abdomen where small bowel loops appear adhesed together. No pneumatosis, portal venous gas or free intraperitoneal air is identified. A few sigmoid diverticula are noted. The colon is otherwise unremarkable. Aortoiliac atherosclerosis without aneurysm is identified. There is no lymphadenopathy or fluid collection. The prostate gland is mildly prominent. Urinary bladder wall is somewhat irregular with small diverticula noted. No focal bony abnormality is seen. IMPRESSION: Findings most consistent with proximal small bowel obstruction likely due to adhesions with a transition point in the left upper quadrant. Marked cardiomegaly. Very small bilateral pleural effusions, greater on the left. Gallstones without evidence of cholecystitis. Aortoiliac atherosclerosis. Mild sigmoid diverticulosis without diverticulitis. Electronically Signed   By: Inge Rise M.D.   On: 05/29/2015 11:43   Dg Chest Port 1 View  06/19/2015  CLINICAL DATA:  Increasing shortness of Breath EXAM: PORTABLE CHEST - 1 VIEW COMPARISON:  05/29/2015 FINDINGS: Cardiac shadow remains enlarged. A pacing device is again seen. Mild vascular congestion  interstitial changes are again seen. No new focal infiltrate or sizable effusion is noted. IMPRESSION: Stable changes when compare with the prior exam. Electronically Signed   By: Inez Catalina M.D.   On: 06/19/2015 15:05   Portable Chest 1 View  05/29/2015  CLINICAL DATA:  Fevers and vomiting EXAM: PORTABLE CHEST - 1 VIEW COMPARISON:  11/16/2014 FINDINGS: Cardiac shadow is enlarged but stable. Vascular congestion and mild interstitial edema is noted. A pacing  device is seen. A nasogastric catheter is seen extending into the stomach. No bony abnormality is noted. IMPRESSION: Mild vascular congestion and interstitial edema. No focal infiltrate is seen. Electronically Signed   By: Inez Catalina M.D.   On: 05/29/2015 17:10   Dg Abd 2 Views  06/19/2015  CLINICAL DATA:  Abdominal distention EXAM: ABDOMEN - 2 VIEW COMPARISON:  05/29/2015 FINDINGS: There is gaseous distension of both the large and small bowel loops. The small bowel loops measure up to 3.1 cm. Findings may reflect ileus or partial distal bowel obstruction. IMPRESSION: 1. Gaseous distention of both large and small bowel loops which may reflect ileus or distal bowel obstruction. Electronically Signed   By: Kerby Moors M.D.   On: 06/19/2015 20:45    Microbiology: Recent Results (from the past 240 hour(s))  Blood Culture (routine x 2)     Status: None (Preliminary result)   Collection Time: 06/19/15  3:14 PM  Result Value Ref Range Status   Specimen Description RIGHT ANTECUBITAL  Final   Special Requests BOTTLES DRAWN AEROBIC ONLY 6CC  Final   Culture PENDING  Incomplete   Report Status PENDING  Incomplete  Blood Culture (routine x 2)     Status: None (Preliminary result)   Collection Time: 06/19/15  3:20 PM  Result Value Ref Range Status   Specimen Description BLOOD LEFT HAND  Final   Special Requests BOTTLES DRAWN AEROBIC AND ANAEROBIC 6CC  Final   Culture PENDING  Incomplete   Report Status PENDING  Incomplete  Urine culture      Status: None   Collection Time: 06/19/15  4:00 PM  Result Value Ref Range Status   Specimen Description URINE, CATHETERIZED  Final   Special Requests NONE  Final   Culture   Final    NO GROWTH 1 DAY Performed at [facility]    Report Status 06/20/2015 FINAL  Final     Labs: Basic Metabolic Panel:  Recent Labs Lab 06/19/15 1513 06/20/15 0205 06/21/15 0648  NA 141 139 141  K 3.8 3.8 4.3  CL 96* 97* 97*  CO2 37* 35* 35*  GLUCOSE 136* 153* 135*  BUN 22* 23* 34*  CREATININE 1.47* 1.41* 1.89*  CALCIUM 8.9 8.2* 8.5*   Liver Function Tests:  Recent Labs Lab 06/19/15 1513 06/20/15 0205  AST 37 31  ALT 25 23  ALKPHOS 43 34*  BILITOT 0.8 0.9  PROT 7.1 6.0*  ALBUMIN 3.6 2.9*   No results for input(s): LIPASE, AMYLASE in the last 168 hours. No results for input(s): AMMONIA in the last 168 hours. CBC:  Recent Labs Lab 06/19/15 1513 06/20/15 0205 06/21/15 0648  WBC 10.5 6.1 12.0*  NEUTROABS 8.3*  --   --   HGB 10.5* 8.9* 9.8*  HCT 33.8* 28.5* 31.7*  MCV 95.2 95.0 96.4  PLT 310 249 288   Cardiac Enzymes:  Recent Labs Lab 06/19/15 1513 06/19/15 1951 06/20/15 0205 06/20/15 0705  TROPONINI 0.06* 0.05* 0.05* 0.06*   BNP: BNP (last 3 results)  Recent Labs  06/19/15 1520 06/21/15 0734  BNP 693.0* 671.0*    ProBNP (last 3 results) No results for input(s): PROBNP in the last 8760 hours.  CBG: No results for input(s): GLUCAP in the last 168 hours.     Signed:  Annita Brod  [facility] 06/21/2015, 11:29 AM

## 2015-06-24 LAB — CULTURE, BLOOD (ROUTINE X 2)
CULTURE: NO GROWTH
CULTURE: NO GROWTH

## 2015-07-11 NOTE — Telephone Encounter (Signed)
Pt has expired

## 2015-07-11 DEATH — deceased

## 2016-06-03 IMAGING — CR DG CHEST 1V PORT
1 series · 1 of 1 positions shown · non-contrast
Comparison: 11/16/2014

CLINICAL DATA: Fevers and vomiting

EXAM:
PORTABLE CHEST - 1 VIEW

[ap]
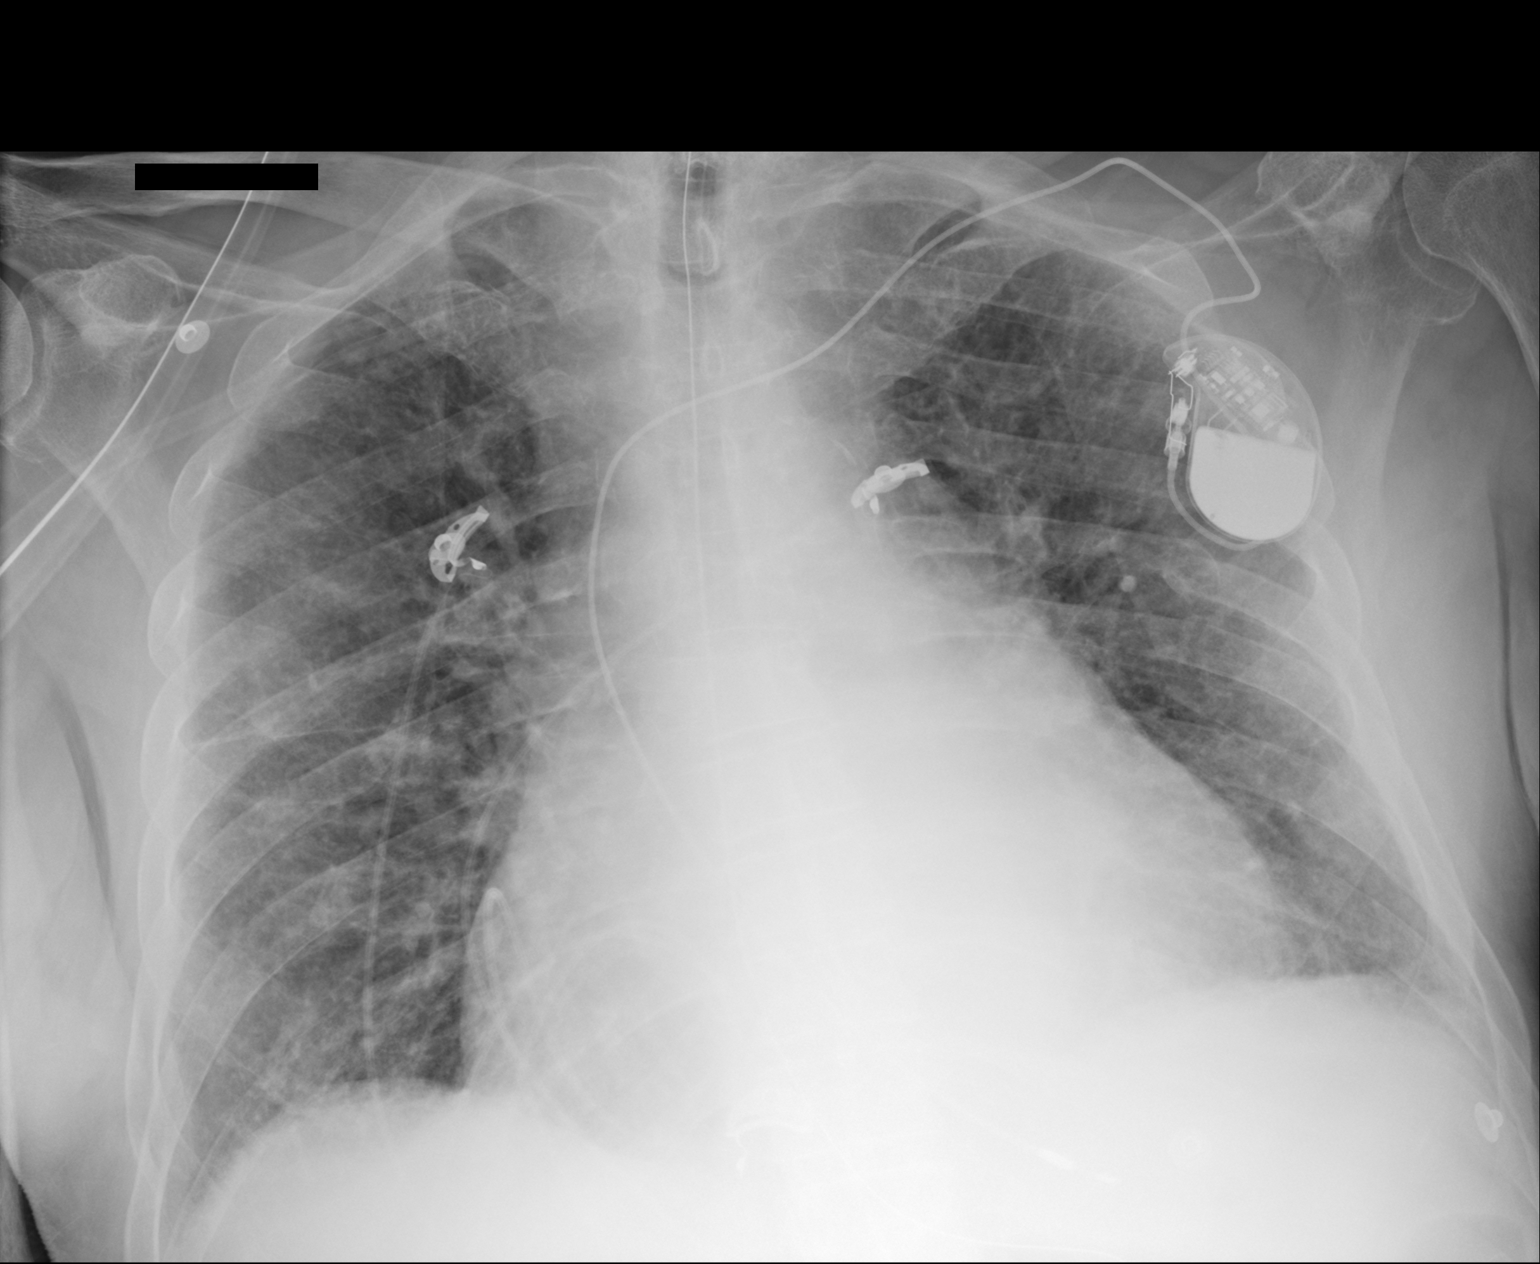

[1 of 1 positions shown; findings below may reference images not displayed]

FINDINGS: Cardiac shadow is enlarged but stable. Vascular congestion and mild
interstitial edema is noted. A pacing device is seen. A nasogastric
catheter is seen extending into the stomach. No bony abnormality is
noted.
IMPRESSION: Mild vascular congestion and interstitial edema.

No focal infiltrate is seen.

## 2016-06-03 IMAGING — CT CT ABD-PELV W/O CM
2 of 4 series · 16 of 46 positions shown, 18 images · non-contrast
Comparison: None.

CLINICAL DATA: Abdominal pain, distention and vomiting for 3 days.
History of colon cancer. Initial encounter.

EXAM:
CT ABDOMEN AND PELVIS WITHOUT CONTRAST
TECHNIQUE: Multidetector CT imaging of the abdomen and pelvis was performed
following the standard protocol without IV contrast.

[Series 2: abdomen/pelvis w/o contrast · axial · non-contrast · 0.79mm/px · z∈[-432,+24]mm · 13 of 101 slices shown, 15 images]
[im 5/101  soft-tissue]
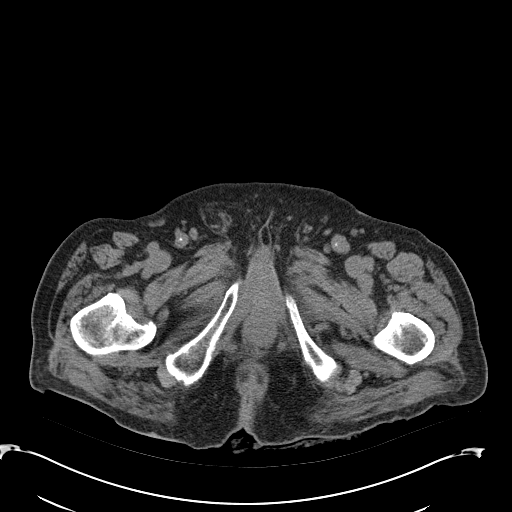
[im 5/101  bone]
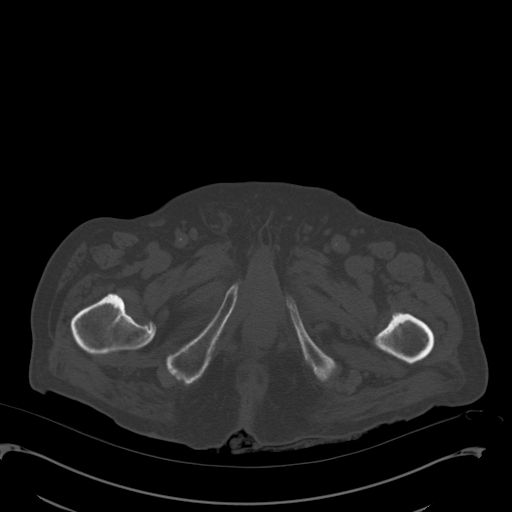
[im 13/101  soft-tissue]
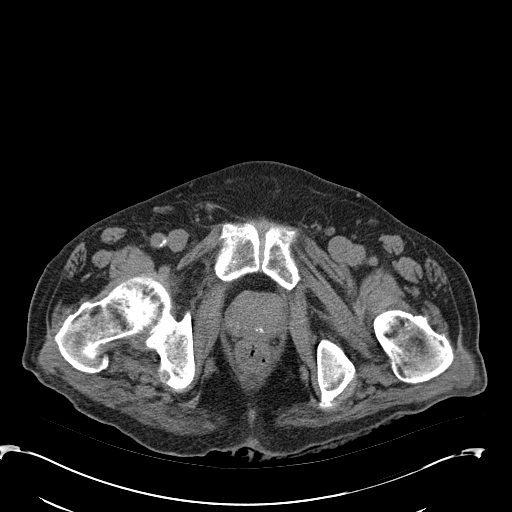
[im 21/101  soft-tissue]
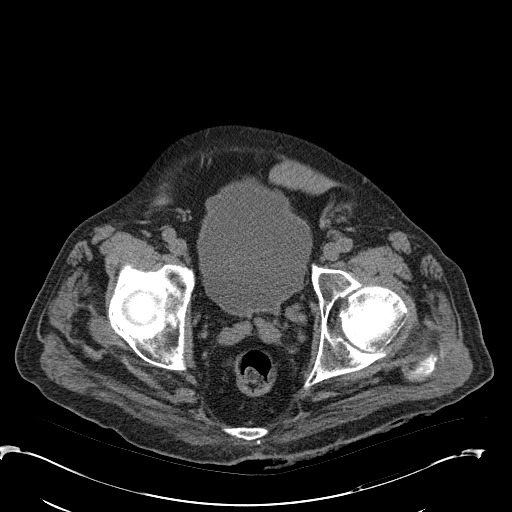
[im 30/101  soft-tissue]
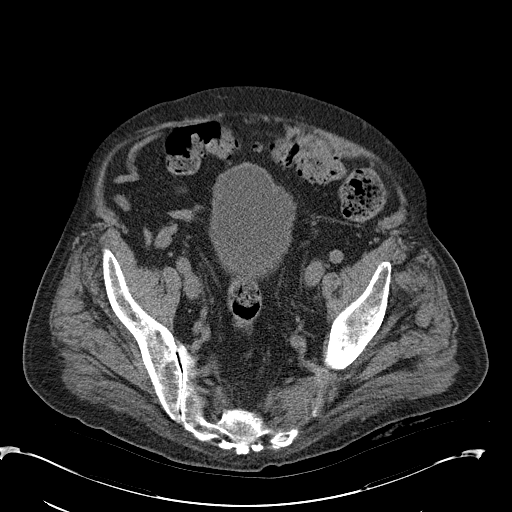
[im 34/101  soft-tissue]
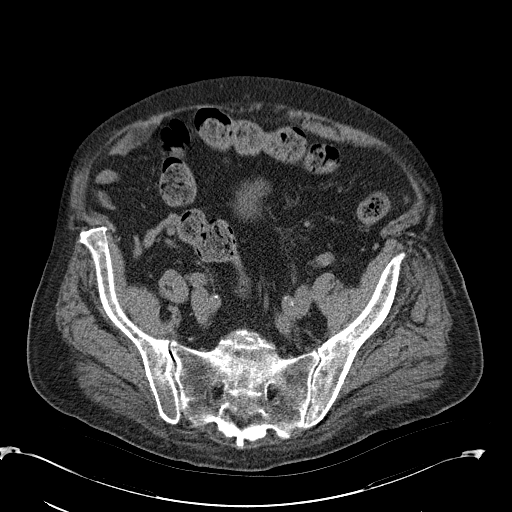
[im 42/101  soft-tissue]
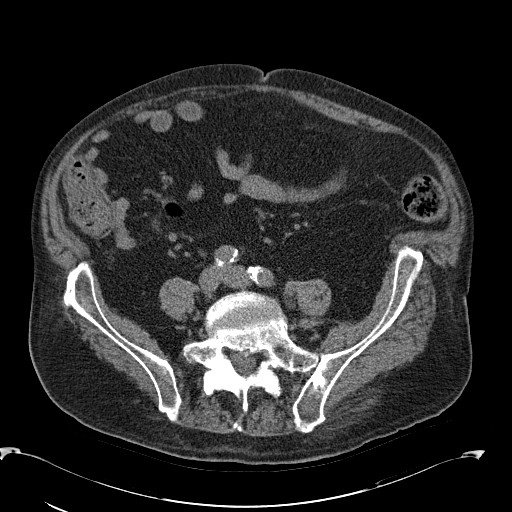
[im 51/101  soft-tissue]
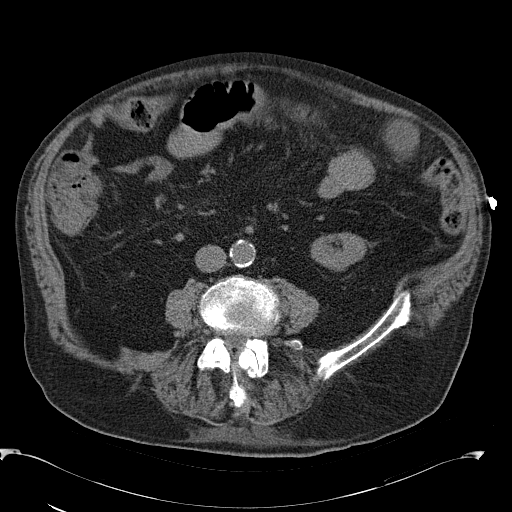
[im 59/101  soft-tissue]
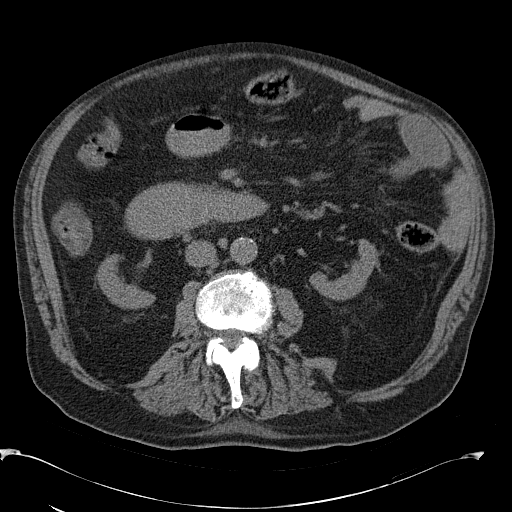
[im 67/101  soft-tissue]
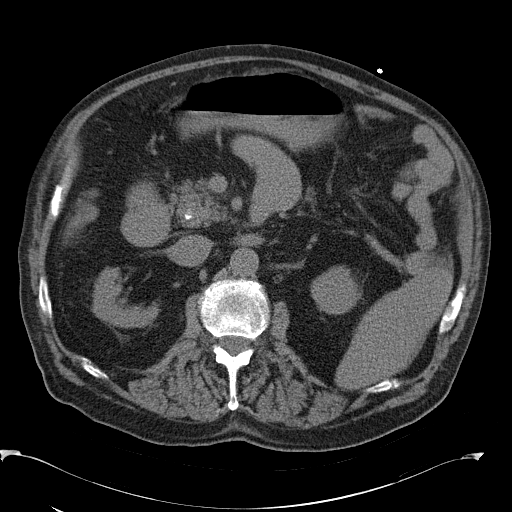
[im 67/101  bone]
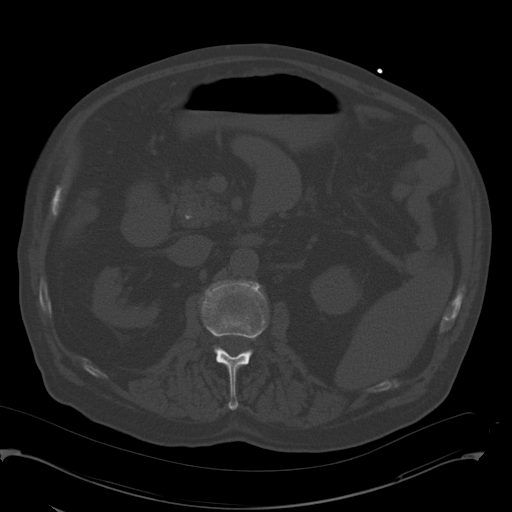
[im 71/101  soft-tissue]
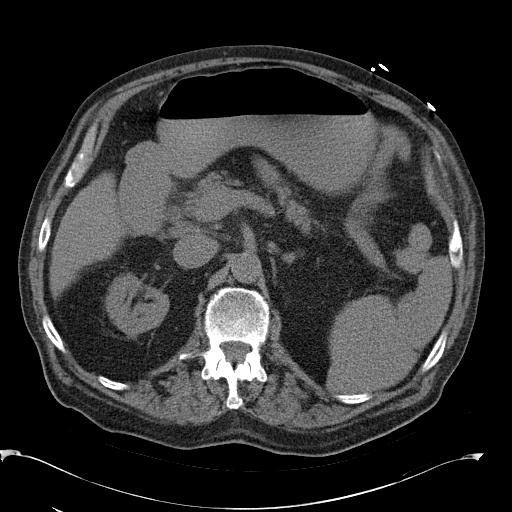
[im 80/101  soft-tissue]
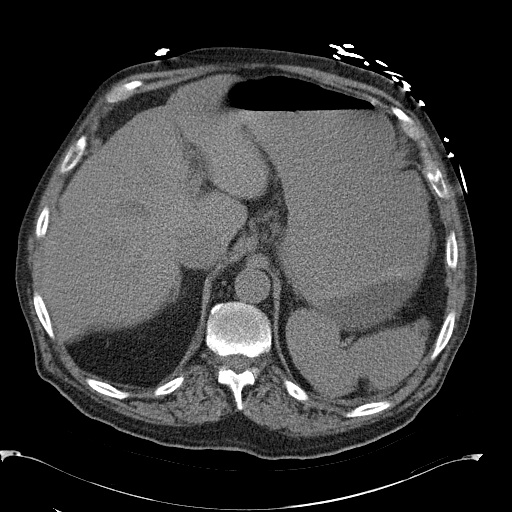
[im 88/101  soft-tissue]
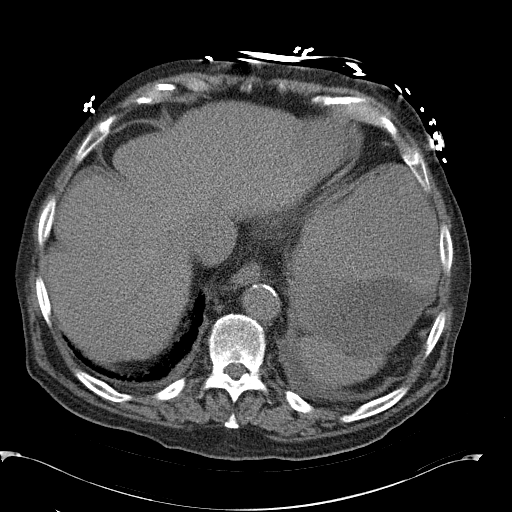
[im 96/101  soft-tissue]
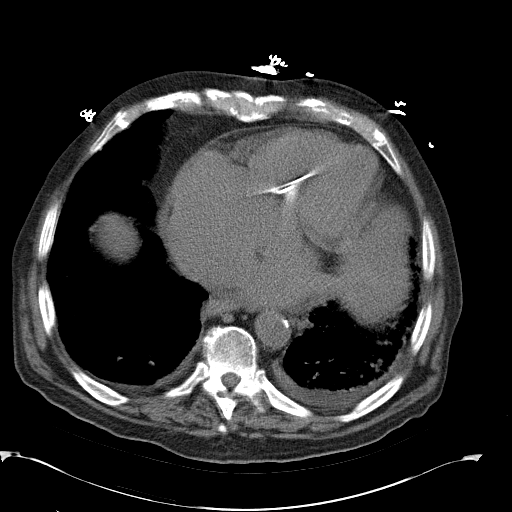

[Series 4: mpr cor (id) · coronal · 0.78mm/px · 3 of 115 slices shown]
[im 39/115  soft-tissue]
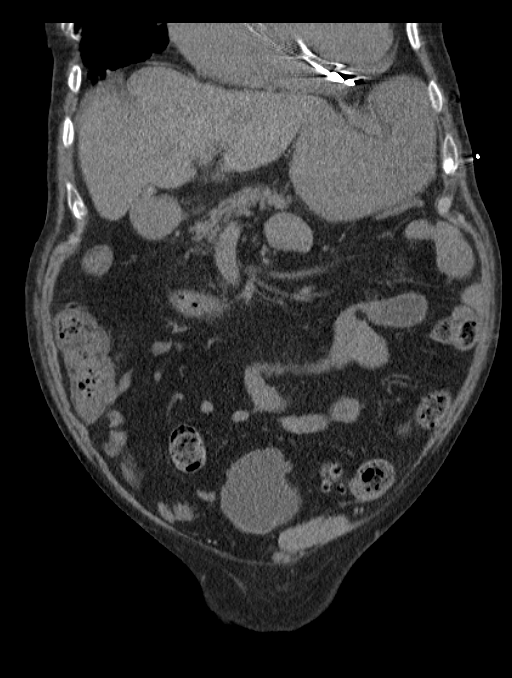
[im 51/115  soft-tissue]
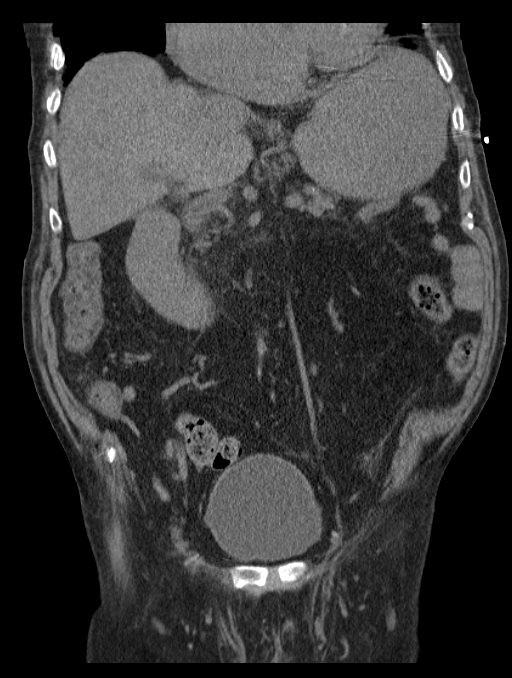
[im 64/115  soft-tissue]
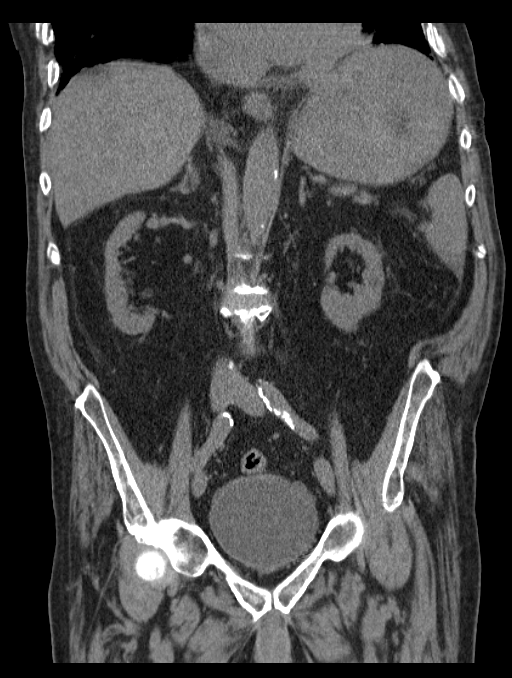

[16 of 46 positions shown; findings below may reference images not displayed]

FINDINGS: There are small bilateral pleural effusions, greater on the left,
with some associated basilar atelectasis. Cardiomegaly is noted. No
pericardial effusion.

A few stones are seen in a decompressed gallbladder. There is no
evidence of cholecystitis. A 1.0 cm in diameter hypoattenuating
lesion is seen in the inferior right hepatic lobe on image 29 with
density units of 5.2 most consistent with a cyst. The liver is
otherwise unremarkable. The spleen, adrenal glands, pancreas and
biliary tree are unremarkable. The kidneys are atrophic bilaterally.
Small left renal cyst is incidentally noted.

The stomach is somewhat distended and there is mild distention of
the proximal jejunum up to approximately 4.1 cm transition point
appears to lie in the left upper quadrant the abdomen where small
bowel loops appear adhesed together. No pneumatosis, portal venous
gas or free intraperitoneal air is identified. A few sigmoid
diverticula are noted. The colon is otherwise unremarkable.
Aortoiliac atherosclerosis without aneurysm is identified. There is
no lymphadenopathy or fluid collection.

The prostate gland is mildly prominent. Urinary bladder wall is
somewhat irregular with small diverticula noted.

No focal bony abnormality is seen.
IMPRESSION: Findings most consistent with proximal small bowel obstruction
likely due to adhesions with a transition point in the left upper
quadrant.

Marked cardiomegaly.

Very small bilateral pleural effusions, greater on the left.

Gallstones without evidence of cholecystitis.

Aortoiliac atherosclerosis.

Mild sigmoid diverticulosis without diverticulitis.

## 2016-06-24 IMAGING — CR DG ABDOMEN 2V
1 series · 3 of 3 positions shown · non-contrast
Comparison: 05/29/2015

CLINICAL DATA: Abdominal distention

EXAM:
ABDOMEN - 2 VIEW

[Series 1: erect ap · 0.17mm/px · 3 of 3 slices shown]
[im 1/3]
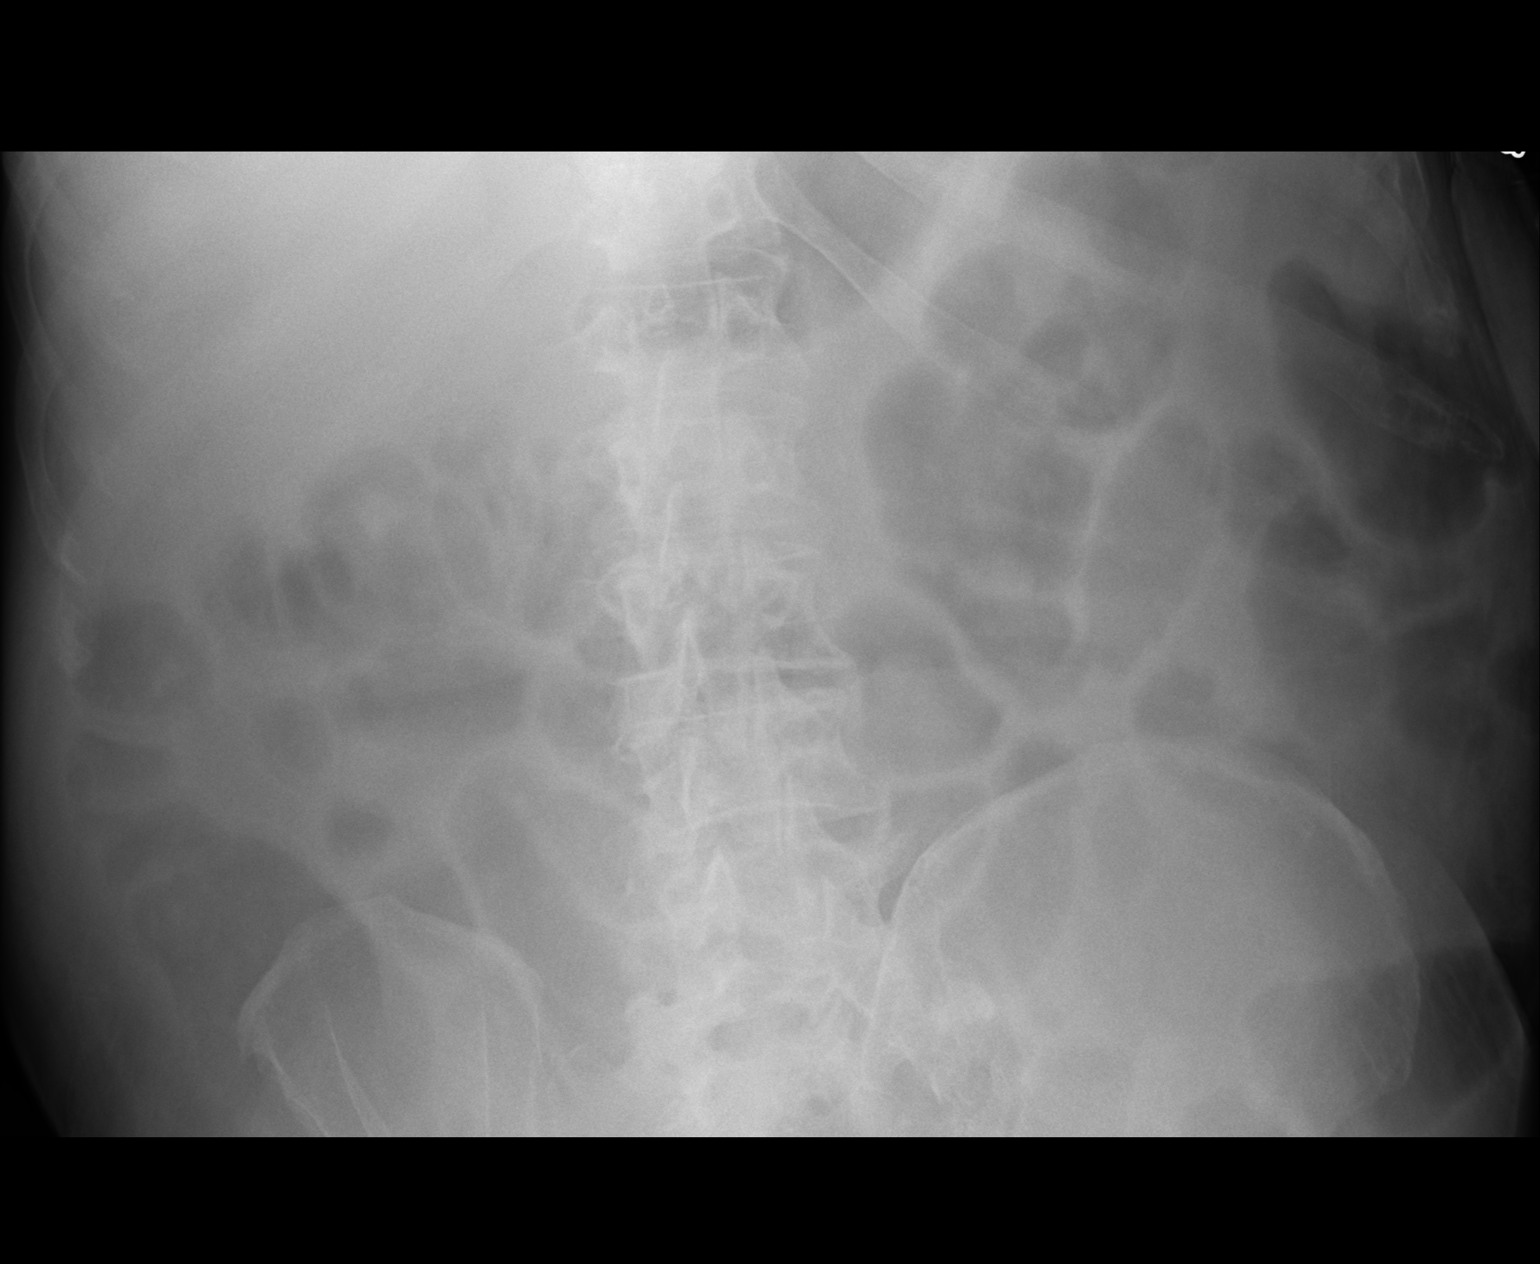
[im 2/3]
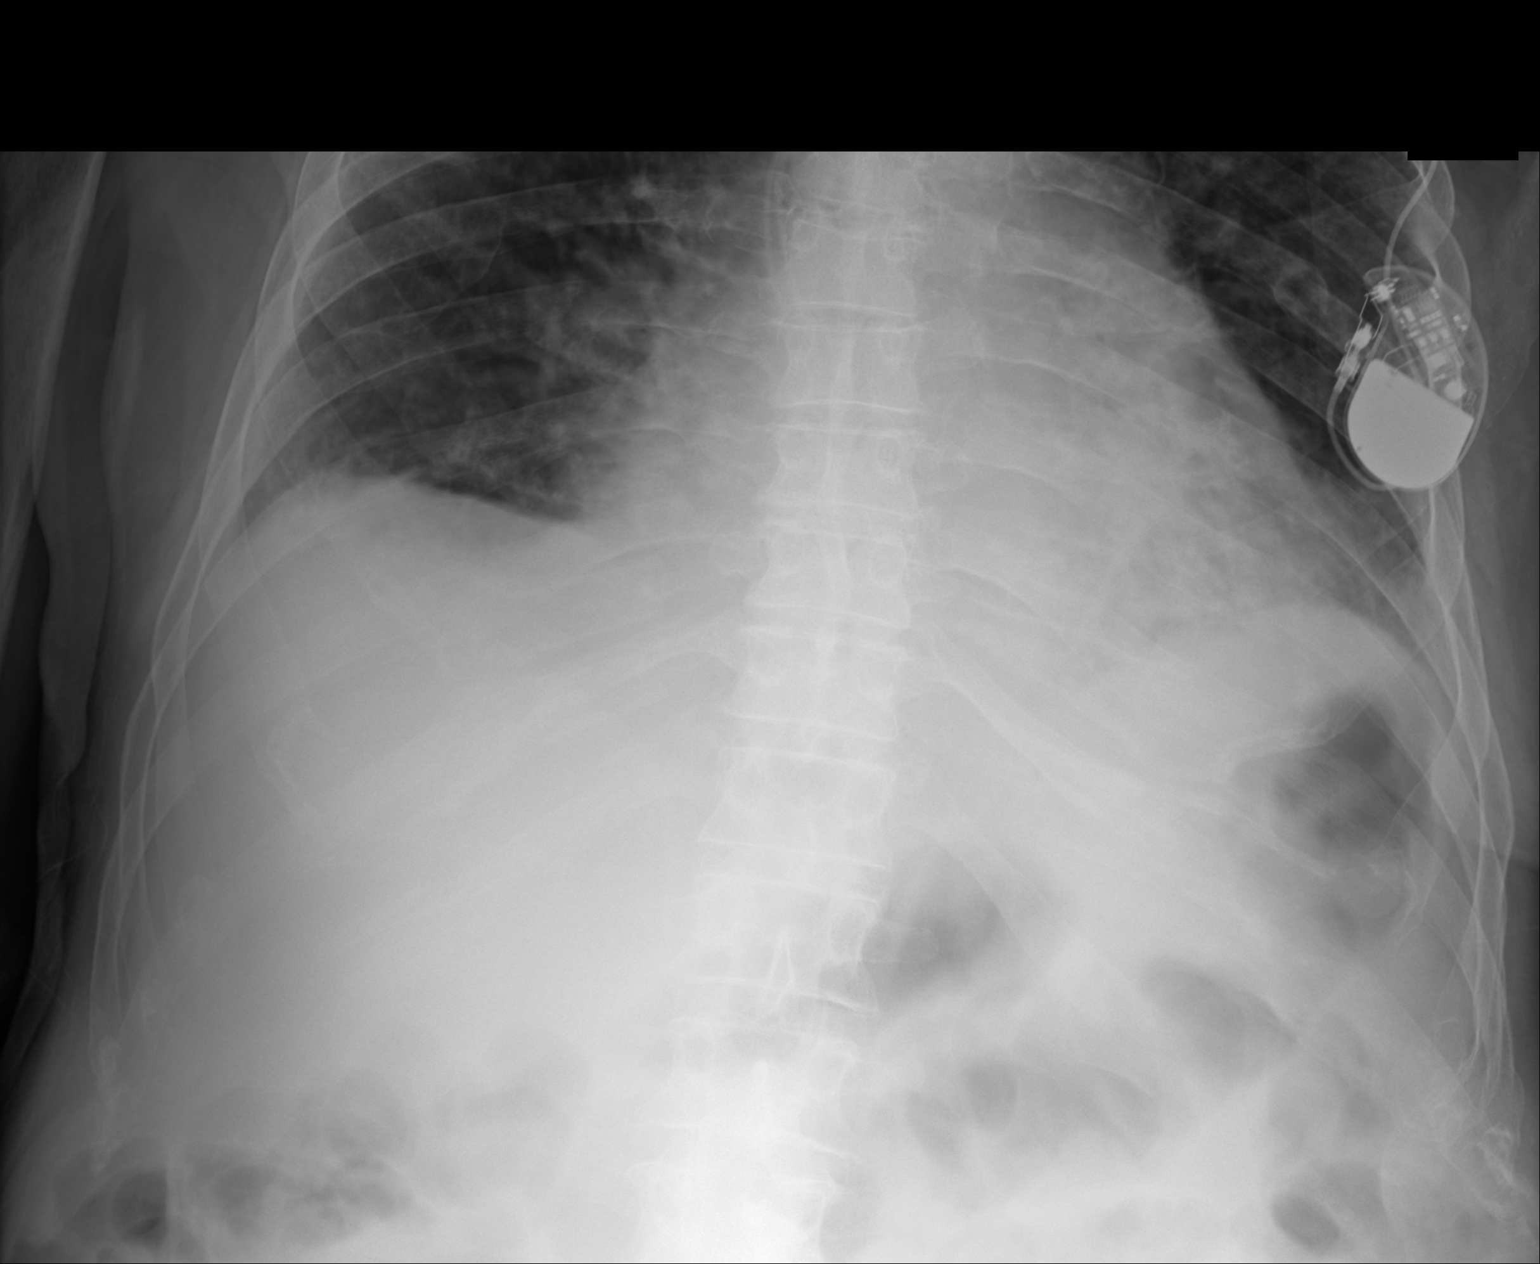
[im 3/3]
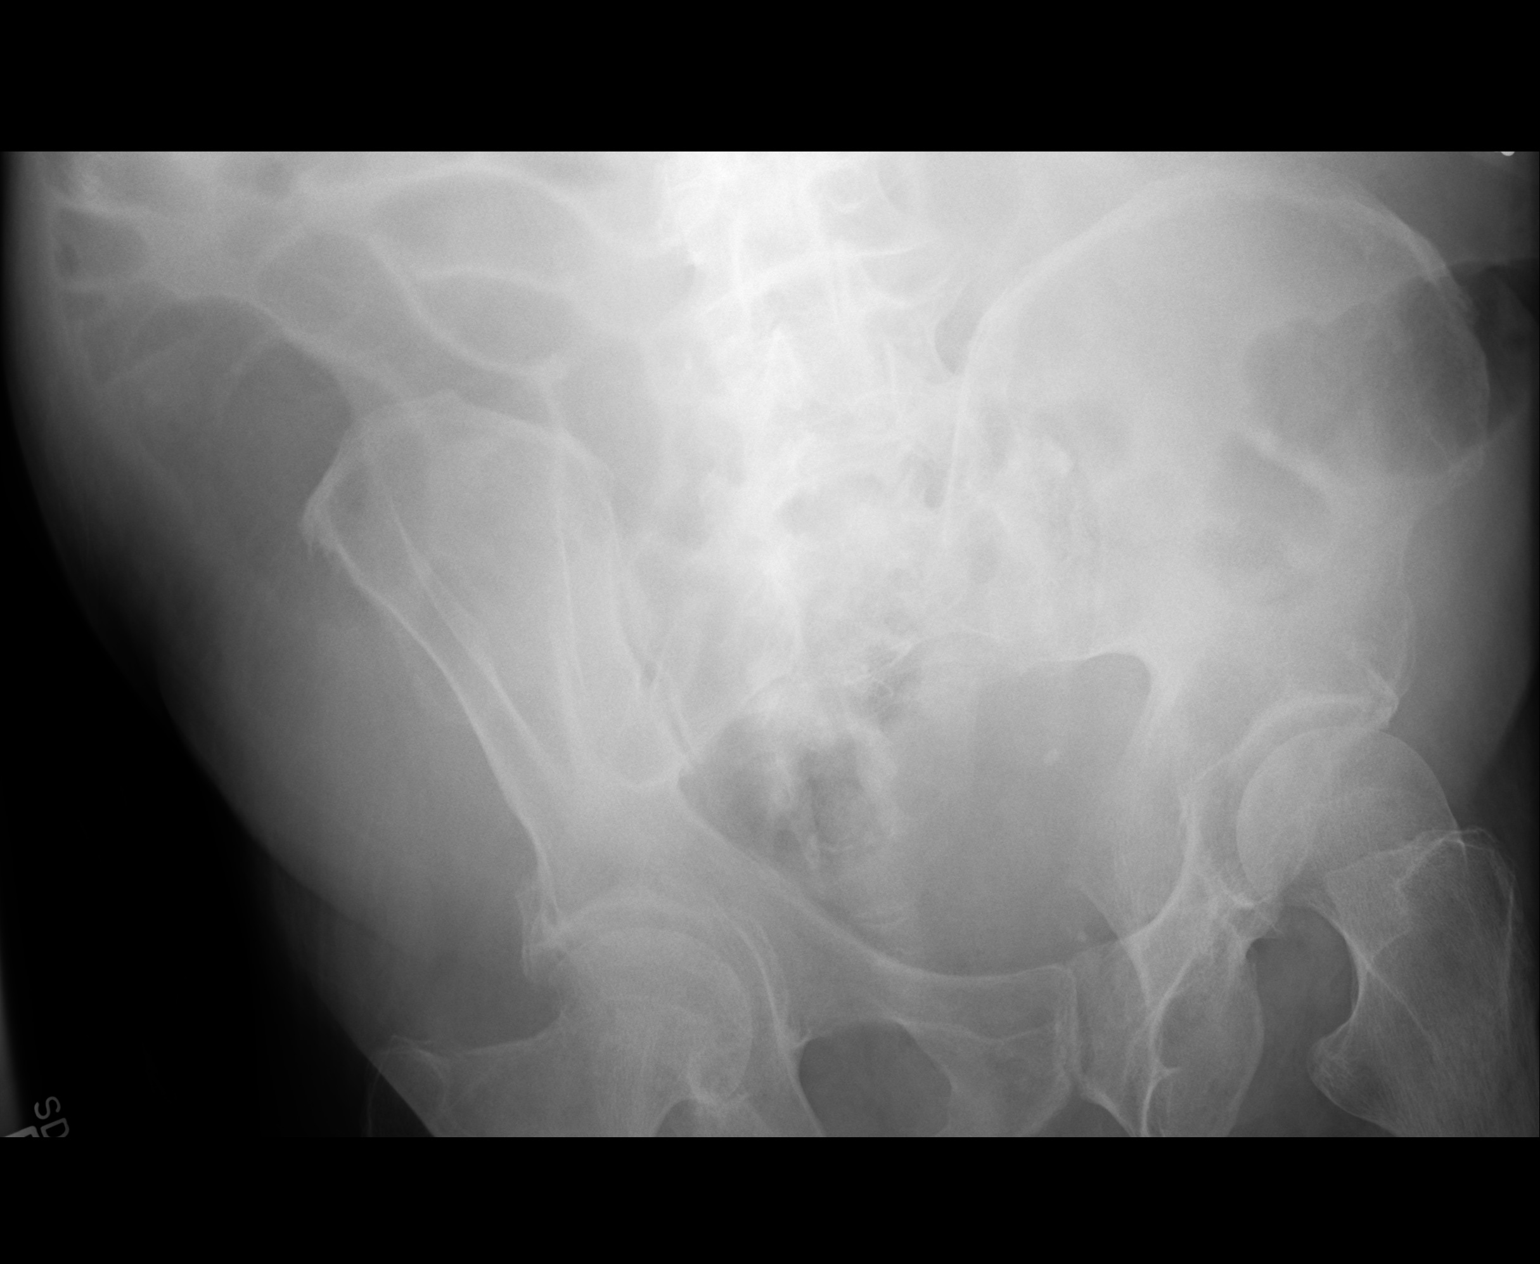

[3 of 3 positions shown; findings below may reference images not displayed]

FINDINGS: There is gaseous distension of both the large and small bowel loops.
The small bowel loops measure up to 3.1 cm. Findings may reflect
ileus or partial distal bowel obstruction.
IMPRESSION: 1. Gaseous distention of both large and small bowel loops which may
reflect ileus or distal bowel obstruction.

## 2016-06-24 IMAGING — CR DG CHEST 1V PORT
1 series · 1 of 1 positions shown · non-contrast
Comparison: 05/29/2015

CLINICAL DATA: Increasing shortness of Breath

EXAM:
PORTABLE CHEST - 1 VIEW

[ap portable]
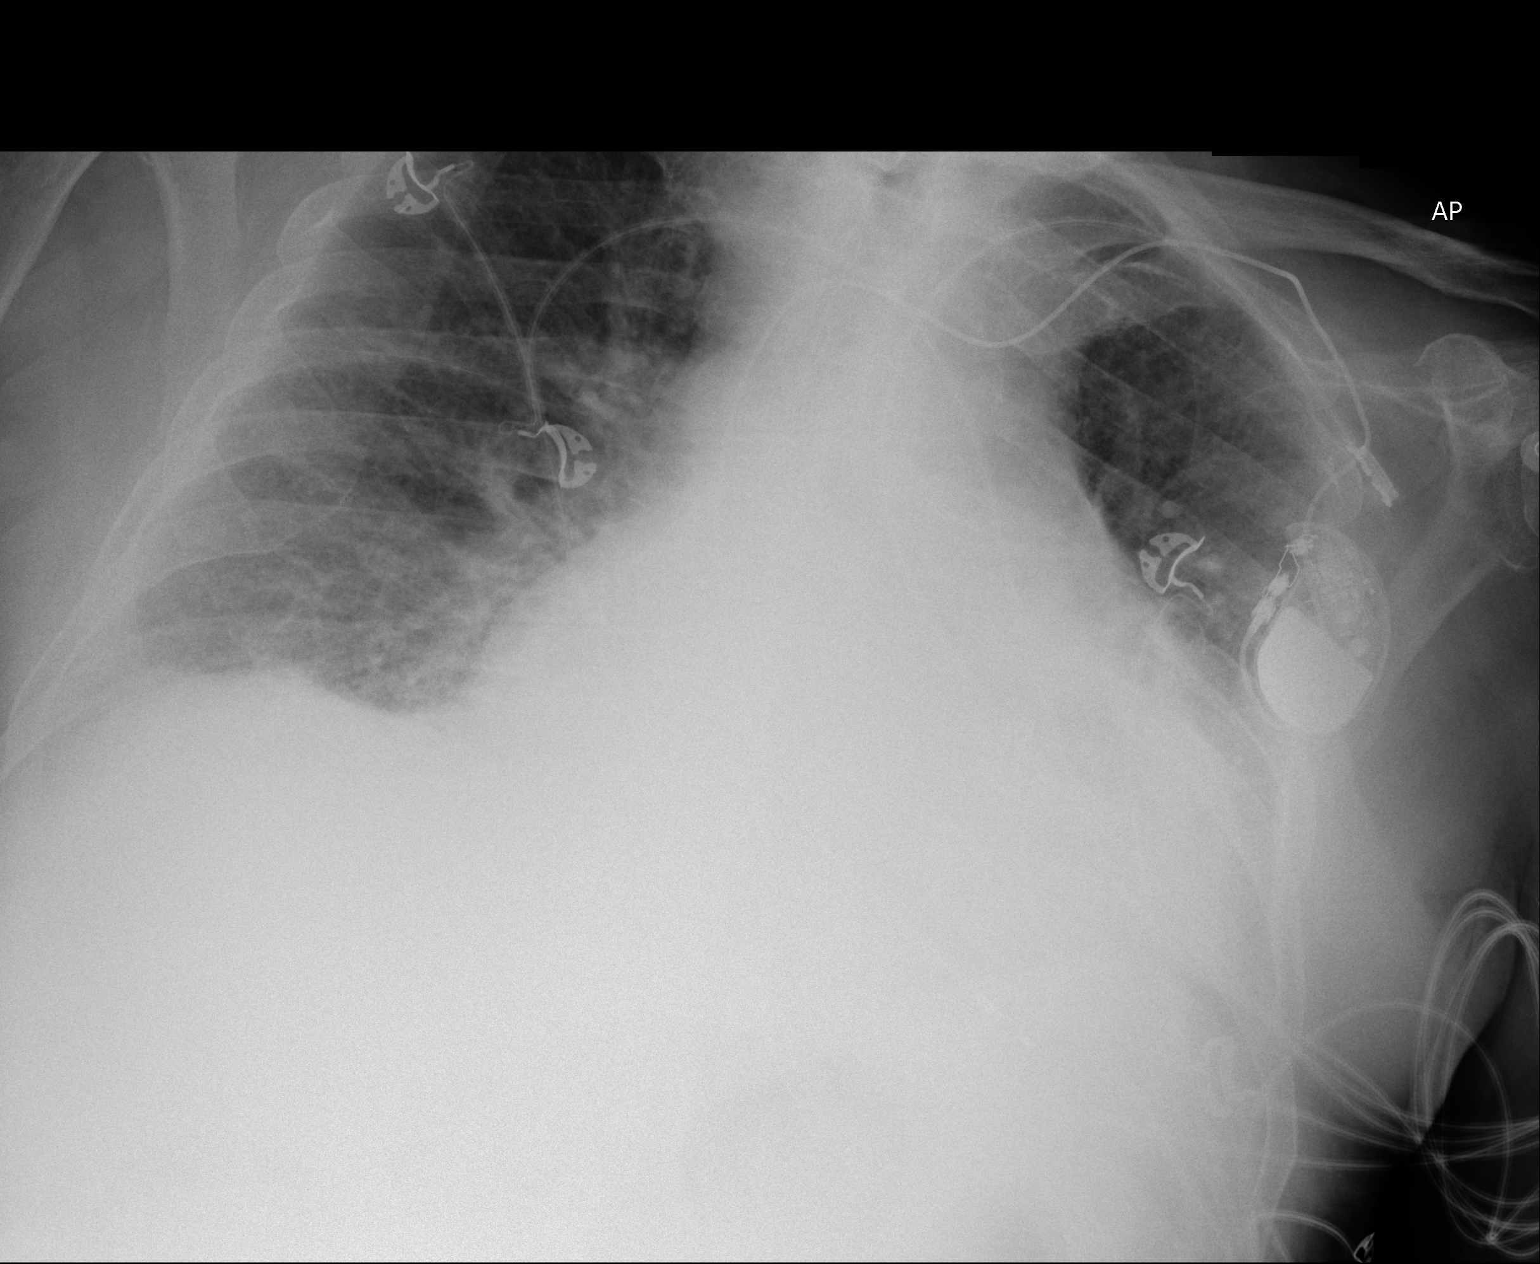

[1 of 1 positions shown; findings below may reference images not displayed]

FINDINGS: Cardiac shadow remains enlarged. A pacing device is again seen. Mild
vascular congestion interstitial changes are again seen. No new
focal infiltrate or sizable effusion is noted.
IMPRESSION: Stable changes when compare with the prior exam.
